# Patient Record
Sex: Female | Born: 1980
Health system: Southern US, Community
[De-identification: ages and names within clinical notes are randomized; demographics above are authoritative.]

## PROBLEM LIST (undated history)

## (undated) DIAGNOSIS — IMO0002 Reserved for concepts with insufficient information to code with codable children: Secondary | ICD-10-CM

## (undated) DIAGNOSIS — R87619 Unspecified abnormal cytological findings in specimens from cervix uteri: Secondary | ICD-10-CM

## (undated) HISTORY — PX: OTHER SURGICAL HISTORY: SHX169

---

## 2010-06-06 ENCOUNTER — Ambulatory Visit (HOSPITAL_COMMUNITY): Admission: RE | Admit: 2010-06-06 | Discharge: 2010-06-06 | Payer: Self-pay | Admitting: Physical Therapy

## 2010-12-24 ENCOUNTER — Encounter
Admission: RE | Admit: 2010-12-24 | Discharge: 2010-12-24 | Payer: Self-pay | Source: Home / Self Care | Attending: Family Medicine | Admitting: Family Medicine

## 2011-06-23 LAB — OB RESULTS CONSOLE ABO/RH: RH Type: POSITIVE

## 2011-06-23 LAB — OB RESULTS CONSOLE HEPATITIS B SURFACE ANTIGEN: Hepatitis B Surface Ag: NEGATIVE

## 2011-06-23 LAB — OB RESULTS CONSOLE HIV ANTIBODY (ROUTINE TESTING)
HIV: NONREACTIVE
HIV: NONREACTIVE

## 2011-06-23 LAB — OB RESULTS CONSOLE RPR
RPR: NONREACTIVE
RPR: NONREACTIVE

## 2011-06-23 LAB — OB RESULTS CONSOLE RUBELLA ANTIBODY, IGM: Rubella: NON-IMMUNE/NOT IMMUNE

## 2011-06-23 LAB — OB RESULTS CONSOLE ANTIBODY SCREEN: Antibody Screen: NEGATIVE

## 2011-09-28 LAB — OB RESULTS CONSOLE GC/CHLAMYDIA: Gonorrhea: NEGATIVE

## 2012-01-11 ENCOUNTER — Other Ambulatory Visit (HOSPITAL_COMMUNITY): Payer: Self-pay | Admitting: Radiology

## 2012-01-11 DIAGNOSIS — I493 Ventricular premature depolarization: Secondary | ICD-10-CM

## 2012-01-12 ENCOUNTER — Ambulatory Visit (HOSPITAL_COMMUNITY): Payer: 59 | Attending: Cardiovascular Disease | Admitting: Radiology

## 2012-01-12 DIAGNOSIS — I2510 Atherosclerotic heart disease of native coronary artery without angina pectoris: Secondary | ICD-10-CM | POA: Insufficient documentation

## 2012-01-12 DIAGNOSIS — I4949 Other premature depolarization: Secondary | ICD-10-CM | POA: Insufficient documentation

## 2012-01-12 DIAGNOSIS — I493 Ventricular premature depolarization: Secondary | ICD-10-CM

## 2012-01-13 ENCOUNTER — Encounter (HOSPITAL_COMMUNITY): Payer: Self-pay | Admitting: Family Medicine

## 2012-01-14 ENCOUNTER — Telehealth: Payer: Self-pay | Admitting: Cardiology

## 2012-01-14 NOTE — Telephone Encounter (Signed)
Pt aware of results 

## 2012-01-14 NOTE — Telephone Encounter (Signed)
New problem:  Echo test result.

## 2012-01-14 NOTE — Telephone Encounter (Signed)
Dr Christell Constant ordered testing.  Will forward to him to review with pt.

## 2012-01-20 ENCOUNTER — Institutional Professional Consult (permissible substitution): Payer: Self-pay | Admitting: Cardiology

## 2012-03-18 LAB — OB RESULTS CONSOLE GBS: GBS: NEGATIVE

## 2012-04-16 ENCOUNTER — Inpatient Hospital Stay (HOSPITAL_COMMUNITY)
Admission: AD | Admit: 2012-04-16 | Discharge: 2012-04-18 | DRG: 775 | Disposition: A | Discharge status: Home / Self Care | Payer: 59 | Source: Ambulatory Visit | Attending: Obstetrics and Gynecology | Admitting: Obstetrics and Gynecology

## 2012-04-16 ENCOUNTER — Encounter (HOSPITAL_COMMUNITY): Payer: Self-pay | Admitting: *Deleted

## 2012-04-16 HISTORY — DX: Reserved for concepts with insufficient information to code with codable children: IMO0002

## 2012-04-16 HISTORY — DX: Unspecified abnormal cytological findings in specimens from cervix uteri: R87.619

## 2012-04-16 LAB — CBC
MCH: 28.1 pg (ref 26.0–34.0)
MCHC: 32.7 g/dL (ref 30.0–36.0)
RDW: 14.2 % (ref 11.5–15.5)

## 2012-04-16 MED ORDER — ONDANSETRON HCL 4 MG/2ML IJ SOLN: 4.0000 mg | Freq: Four times a day (QID) | INTRAMUSCULAR | Status: DC | PRN

## 2012-04-16 MED ORDER — LIDOCAINE HCL (PF) 1 % IJ SOLN
30.0000 mL | INTRAMUSCULAR | Status: DC | PRN
  Administered 2012-04-16: 30 mL via SUBCUTANEOUS
  Filled 2012-04-16: qty 30

## 2012-04-16 MED ORDER — OXYCODONE-ACETAMINOPHEN 5-325 MG PO TABS: 1.0000 | ORAL_TABLET | ORAL | Status: DC | PRN

## 2012-04-16 MED ORDER — FLEET ENEMA 7-19 GM/118ML RE ENEM: 1.0000 | ENEMA | RECTAL | Status: DC | PRN

## 2012-04-16 MED ORDER — ACETAMINOPHEN 325 MG PO TABS: 650.0000 mg | ORAL_TABLET | ORAL | Status: DC | PRN

## 2012-04-16 MED ORDER — CITRIC ACID-SODIUM CITRATE 334-500 MG/5ML PO SOLN: 30.0000 mL | ORAL | Status: DC | PRN

## 2012-04-16 MED ORDER — LACTATED RINGERS IV SOLN
INTRAVENOUS | Status: DC
  Administered 2012-04-16: 21:00:00 via INTRAVENOUS

## 2012-04-16 MED ORDER — LACTATED RINGERS IV SOLN: 500.0000 mL | INTRAVENOUS | Status: DC | PRN

## 2012-04-16 MED ORDER — OXYTOCIN 20 UNITS IN LACTATED RINGERS INFUSION - SIMPLE
125.0000 mL/h | Freq: Once | INTRAVENOUS | Status: AC
  Administered 2012-04-16: 125 mL/h via INTRAVENOUS

## 2012-04-16 MED ORDER — OXYTOCIN BOLUS FROM INFUSION
500.0000 mL | Freq: Once | INTRAVENOUS | Status: DC
  Filled 2012-04-16: qty 1000
  Filled 2012-04-16: qty 500

## 2012-04-16 MED ORDER — IBUPROFEN 600 MG PO TABS
600.0000 mg | ORAL_TABLET | Freq: Four times a day (QID) | ORAL | Status: DC | PRN
  Administered 2012-04-16: 600 mg via ORAL
  Filled 2012-04-16: qty 1

## 2012-04-16 MED ORDER — BUTORPHANOL TARTRATE 2 MG/ML IJ SOLN: 1.0000 mg | INTRAMUSCULAR | Status: DC | PRN

## 2012-04-16 NOTE — Progress Notes (Signed)
Delivery Note Rapid second stage Terminal bradycardia into 50-70s vtx at LOA, +3 VE D/W pt/husband to shorten second stage in view of bradycardia Straight cath Kiwi applied, 1 uc, gentle 2 finger traction to VE, no pop offs VMI Apgars 8/9, weight/art pH pending Nuchal cord x 1 reduced at delivery Placenta 3 vessels, intact EBL 350cc Cx/vagina without laceration Rt labia minora with transection/bleeding is repaired Superficial ML lac not bleeding , not repaired Patient and infant stable in LDR

## 2012-04-16 NOTE — MAU Note (Signed)
Pt states, " I've been contracting since 1120, and they have been every 3-4 mins apart for an hour."

## 2012-04-16 NOTE — H&P (Signed)
Morgan Mayo is a 31 y.o. female presenting for UCs. SROM in LDR, clear. Maternal Medical History:  Reason for admission: Reason for admission: contractions.  Contractions: Onset was 3-5 hours ago.    Fetal activity: Perceived fetal activity is normal.      OB History    Grav Para Term Preterm Abortions TAB SAB Ect Mult Living   1              Past Medical History  Diagnosis Date  . Abnormal Pap smear    Past Surgical History  Procedure Date  . Belly button    Family History: family history is not on file. Social History:  reports that she has never smoked. She has never used smokeless tobacco. She reports that she does not drink alcohol or use illicit drugs.  Review of Systems  Eyes: Negative for blurred vision.  Gastrointestinal: Negative for abdominal pain.  Neurological: Negative for headaches.    Dilation: Lip/rim Effacement (%): 100 Station: +2 Exam by:: Dr. Henderson Cloud Blood pressure 112/71, pulse 94, temperature 98.1 F (36.7 C), temperature source Oral, resp. rate 20, height 5\' 6"  (1.676 m), weight 88.905 kg (196 lb). Maternal Exam:  Uterine Assessment: Contraction strength is firm.  Contraction frequency is regular.   Abdomen: Patient reports no abdominal tenderness. Fetal presentation: vertex     Physical Exam  Cardiovascular: Normal rate and regular rhythm.   Respiratory: Effort normal and breath sounds normal.  Neurological: She has normal reflexes.    Prenatal labs: ABO, Rh: B/Positive/-- (06/26 0000) Antibody: Negative (06/26 0000) Rubella: Nonimmune (06/26 0000) RPR: Nonreactive (09/25 0000)  HBsAg: Negative (06/26 0000)  HIV: Non-reactive (09/25 0000)  GBS: Negative (03/22 0000)   Assessment/Plan: 31 yo G1P0 at term in active labor. Admit, anticipate vaginal delivery   Lindell Renfrew II,Caileigh Canche E 04/16/2012, 11:27 PM

## 2012-04-17 ENCOUNTER — Encounter (HOSPITAL_COMMUNITY): Payer: Self-pay | Admitting: *Deleted

## 2012-04-17 LAB — CBC
MCH: 27.9 pg (ref 26.0–34.0)
MCHC: 32.3 g/dL (ref 30.0–36.0)
MCV: 86.2 fL (ref 78.0–100.0)
Platelets: 213 10*3/uL (ref 150–400)
RBC: 3.77 MIL/uL — ABNORMAL LOW (ref 3.87–5.11)

## 2012-04-17 LAB — RPR: RPR Ser Ql: NONREACTIVE

## 2012-04-17 MED ORDER — SIMETHICONE 80 MG PO CHEW: 80.0000 mg | CHEWABLE_TABLET | ORAL | Status: DC | PRN

## 2012-04-17 MED ORDER — BENZOCAINE-MENTHOL 20-0.5 % EX AERO
1.0000 "application " | INHALATION_SPRAY | CUTANEOUS | Status: DC | PRN
  Administered 2012-04-17: 1 via TOPICAL

## 2012-04-17 MED ORDER — ONDANSETRON HCL 4 MG PO TABS: 4.0000 mg | ORAL_TABLET | ORAL | Status: DC | PRN

## 2012-04-17 MED ORDER — TETANUS-DIPHTH-ACELL PERTUSSIS 5-2.5-18.5 LF-MCG/0.5 IM SUSP: 0.5000 mL | Freq: Once | INTRAMUSCULAR | Status: DC

## 2012-04-17 MED ORDER — BISACODYL 10 MG RE SUPP: 10.0000 mg | Freq: Every day | RECTAL | Status: DC | PRN

## 2012-04-17 MED ORDER — WITCH HAZEL-GLYCERIN EX PADS: 1.0000 "application " | MEDICATED_PAD | CUTANEOUS | Status: DC | PRN

## 2012-04-17 MED ORDER — DIPHENHYDRAMINE HCL 25 MG PO CAPS: 25.0000 mg | ORAL_CAPSULE | Freq: Four times a day (QID) | ORAL | Status: DC | PRN

## 2012-04-17 MED ORDER — IBUPROFEN 600 MG PO TABS
600.0000 mg | ORAL_TABLET | Freq: Four times a day (QID) | ORAL | Status: DC
  Administered 2012-04-17 – 2012-04-18 (×5): 600 mg via ORAL
  Filled 2012-04-17 (×5): qty 1

## 2012-04-17 MED ORDER — DIBUCAINE 1 % RE OINT: 1.0000 "application " | TOPICAL_OINTMENT | RECTAL | Status: DC | PRN

## 2012-04-17 MED ORDER — ONDANSETRON HCL 4 MG/2ML IJ SOLN: 4.0000 mg | INTRAMUSCULAR | Status: DC | PRN

## 2012-04-17 MED ORDER — FLEET ENEMA 7-19 GM/118ML RE ENEM: 1.0000 | ENEMA | Freq: Every day | RECTAL | Status: DC | PRN

## 2012-04-17 MED ORDER — SENNOSIDES-DOCUSATE SODIUM 8.6-50 MG PO TABS
2.0000 | ORAL_TABLET | Freq: Every day | ORAL | Status: DC
  Administered 2012-04-17: 2 via ORAL

## 2012-04-17 MED ORDER — LANOLIN HYDROUS EX OINT: TOPICAL_OINTMENT | CUTANEOUS | Status: DC | PRN

## 2012-04-17 MED ORDER — ZOLPIDEM TARTRATE 5 MG PO TABS: 5.0000 mg | ORAL_TABLET | Freq: Every evening | ORAL | Status: DC | PRN

## 2012-04-17 MED ORDER — OXYCODONE-ACETAMINOPHEN 5-325 MG PO TABS: 1.0000 | ORAL_TABLET | ORAL | Status: DC | PRN

## 2012-04-17 MED ORDER — PRENATAL MULTIVITAMIN CH
1.0000 | ORAL_TABLET | Freq: Every day | ORAL | Status: DC
  Administered 2012-04-17 – 2012-04-18 (×2): 1 via ORAL
  Filled 2012-04-17 (×2): qty 1

## 2012-04-17 MED ORDER — BENZOCAINE-MENTHOL 20-0.5 % EX AERO
INHALATION_SPRAY | CUTANEOUS | Status: AC
  Administered 2012-04-17: 1 via TOPICAL
  Filled 2012-04-17: qty 56

## 2012-04-17 NOTE — Progress Notes (Signed)
Transferred to 115 via wheelchair, infant and husband with pt

## 2012-04-17 NOTE — Progress Notes (Signed)
PPD #1/2 No c/o, decreasing lochia, voiding/ambulating  Blood pressure 118/80, pulse 98, temperature 98.2 F (36.8 C), temperature source Oral, resp. rate 18, height 5\' 6"  (1.676 m), weight 88.905 kg (196 lb), SpO2 95.00%, unknown if currently breastfeeding.  FFNT  Results for orders placed during the hospital encounter of 04/16/12 (from the past 24 hour(s))  CBC     Status: Abnormal   Collection Time   04/16/12  9:30 PM      Component Value Range   WBC 15.6 (*) 4.0 - 10.5 (K/uL)   RBC 4.49  3.87 - 5.11 (MIL/uL)   Hemoglobin 12.6  12.0 - 15.0 (g/dL)   HCT 16.1  09.6 - 04.5 (%)   MCV 85.7  78.0 - 100.0 (fL)   MCH 28.1  26.0 - 34.0 (pg)   MCHC 32.7  30.0 - 36.0 (g/dL)   RDW 40.9  81.1 - 91.4 (%)   Platelets 231  150 - 400 (K/uL)  CBC     Status: Abnormal   Collection Time   04/17/12  5:00 AM      Component Value Range   WBC 20.3 (*) 4.0 - 10.5 (K/uL)   RBC 3.77 (*) 3.87 - 5.11 (MIL/uL)   Hemoglobin 10.5 (*) 12.0 - 15.0 (g/dL)   HCT 78.2 (*) 95.6 - 46.0 (%)   MCV 86.2  78.0 - 100.0 (fL)   MCH 27.9  26.0 - 34.0 (pg)   MCHC 32.3  30.0 - 36.0 (g/dL)   RDW 21.3  08.6 - 57.8 (%)   Platelets 213  150 - 400 (K/uL)   A: Satisfactory  P: per orders      Prob D/C tomorrow

## 2012-04-18 NOTE — Discharge Summary (Signed)
Obstetric Discharge Summary Reason for Admission: onset of labor Prenatal Procedures: ultrasound Intrapartum Procedures: vacuum Postpartum Procedures: none Complications-Operative and Postpartum: labial laceration Hemoglobin  Date Value Range Status  04/17/2012 10.5* 12.0-15.0 (g/dL) Final     HCT  Date Value Range Status  04/17/2012 32.5* 36.0-46.0 (%) Final    Physical Exam:  General: alert and cooperative Lochia: appropriate Uterine Fundus: firm Incision: n/a DVT Evaluation: No evidence of DVT seen on physical exam.  Discharge Diagnoses: Term Pregnancy-delivered  Discharge Information: Date: 04/18/2012 Activity: pelvic rest Diet: routine Medications: PNV and Ibuprofen Condition: stable Instructions: refer to practice specific booklet Discharge to: home Follow-up Information    Schedule an appointment as soon as possible for a visit in 6 weeks to follow up.         Newborn Data: Live born female  Birth Weight: 6 lb 3.7 oz (2825 g) APGAR: 8, 9  Home with mother.  Morgan Mayo 04/18/2012, 8:47 AM

## 2012-04-19 ENCOUNTER — Inpatient Hospital Stay (HOSPITAL_COMMUNITY): Payer: 59

## 2013-04-18 ENCOUNTER — Other Ambulatory Visit: Payer: Self-pay | Admitting: Nurse Practitioner

## 2013-04-18 MED ORDER — CEPHALEXIN 500 MG PO CAPS: 500.0000 mg | ORAL_CAPSULE | Freq: Four times a day (QID) | ORAL | Status: DC

## 2013-08-09 ENCOUNTER — Ambulatory Visit (INDEPENDENT_AMBULATORY_CARE_PROVIDER_SITE_OTHER): Payer: 59

## 2013-08-09 ENCOUNTER — Other Ambulatory Visit: Payer: Self-pay | Admitting: Family Medicine

## 2013-08-09 DIAGNOSIS — M542 Cervicalgia: Secondary | ICD-10-CM

## 2013-11-01 ENCOUNTER — Ambulatory Visit (INDEPENDENT_AMBULATORY_CARE_PROVIDER_SITE_OTHER): Payer: 59 | Admitting: General Practice

## 2013-11-01 ENCOUNTER — Encounter: Payer: Self-pay | Admitting: General Practice

## 2013-11-01 VITALS — BP 122/78 | HR 92 | Temp 98.5°F | Ht 66.5 in | Wt 170.5 lb

## 2013-11-01 DIAGNOSIS — Z0000 Encounter for general adult medical examination without abnormal findings: Secondary | ICD-10-CM

## 2013-11-01 NOTE — Patient Instructions (Signed)
Exercise to Stay Healthy Exercise helps you become and stay healthy. EXERCISE IDEAS AND TIPS Choose exercises that:  You enjoy.  Fit into your day. You do not need to exercise really hard to be healthy. You can do exercises at a slow or medium level and stay healthy. You can:  Stretch before and after working out.  Try yoga, Pilates, or tai chi.  Lift weights.  Walk fast, swim, jog, run, climb stairs, bicycle, dance, or rollerskate.  Take aerobic classes. Exercises that burn about 150 calories:  Running 1  miles in 15 minutes.  Playing volleyball for 45 to 60 minutes.  Washing and waxing a car for 45 to 60 minutes.  Playing touch football for 45 minutes.  Walking 1  miles in 35 minutes.  Pushing a stroller 1  miles in 30 minutes.  Playing basketball for 30 minutes.  Raking leaves for 30 minutes.  Bicycling 5 miles in 30 minutes.  Walking 2 miles in 30 minutes.  Dancing for 30 minutes.  Shoveling snow for 15 minutes.  Swimming laps for 20 minutes.  Walking up stairs for 15 minutes.  Bicycling 4 miles in 15 minutes.  Gardening for 30 to 45 minutes.  Jumping rope for 15 minutes.  Washing windows or floors for 45 to 60 minutes. Document Released: 01/16/2011 Document Revised: 03/07/2012 Document Reviewed: 01/16/2011 [employer] Patient Information 2014 [employer].  

## 2013-11-01 NOTE — Progress Notes (Signed)
  Subjective:    Patient ID: Morgan Mayo, female    DOB: [birth year], 32 y.o.   MRN: [id number]  HPI Patient presents today for annual physical exam excluding pap. Reports working on eating a healthy diet and incorporating regular exercise. She denies any problems or concerns at this time.     Review of Systems  Constitutional: Negative for fever and chills.  Respiratory: Negative for chest tightness and shortness of breath.   Cardiovascular: Negative for chest pain and palpitations.  Gastrointestinal: Negative for nausea, vomiting, abdominal pain, diarrhea, constipation and blood in stool.  Genitourinary: Negative for dysuria, hematuria, flank pain and difficulty urinating.  Musculoskeletal: Negative for back pain and neck pain.  Neurological: Negative for dizziness, weakness and headaches.  Psychiatric/Behavioral: Negative for suicidal ideas.       Objective:   Physical Exam  Constitutional: She is oriented to person, place, and time. She appears well-developed and well-nourished.  HENT:  Head: Normocephalic and atraumatic.  Right Ear: External ear normal.  Left Ear: External ear normal.  Mouth/Throat: Oropharynx is clear and moist.  Eyes: Conjunctivae and EOM are normal. Pupils are equal, round, and reactive to light.  Neck: Normal range of motion. Neck supple. No thyromegaly present.  Cardiovascular: Normal rate, regular rhythm and normal heart sounds.   Pulmonary/Chest: Effort normal and breath sounds normal. No respiratory distress. She exhibits no tenderness.  Abdominal: Soft. Bowel sounds are normal. She exhibits no distension. There is no tenderness.  Lymphadenopathy:    She has no cervical adenopathy.  Neurological: She is alert and oriented to person, place, and time.  Skin: Skin is warm and dry.  Psychiatric: She has a normal mood and affect.          Assessment & Plan:  1. Annual physical exam - POCT CBC; Future - CMP14+EGFR; Future - NMR, lipoprofile;  Future -discussed regular exercise and healthy eating -RTO prn -Patient verbalized understanding -Coralie Keens, FNP-C

## 2013-11-02 ENCOUNTER — Other Ambulatory Visit (INDEPENDENT_AMBULATORY_CARE_PROVIDER_SITE_OTHER): Payer: 59

## 2013-11-02 DIAGNOSIS — Z0000 Encounter for general adult medical examination without abnormal findings: Secondary | ICD-10-CM

## 2013-11-02 LAB — POCT CBC
HCT, POC: 40.4 % (ref 37.7–47.9)
Lymph, poc: 1.5 (ref 0.6–3.4)
MCH, POC: 28 pg (ref 27–31.2)
MCHC: 33.5 g/dL (ref 31.8–35.4)
MCV: 83.6 fL (ref 80–97)
RDW, POC: 13.6 %
WBC: 5.1 10*3/uL (ref 4.6–10.2)

## 2013-11-02 NOTE — Progress Notes (Signed)
Pt came in for labs only 

## 2013-11-03 NOTE — Progress Notes (Signed)
Patient aware of results.

## 2013-11-04 LAB — CMP14+EGFR
ALT: 17 IU/L (ref 0–32)
AST: 17 IU/L (ref 0–40)
Albumin/Globulin Ratio: 1.5 (ref 1.1–2.5)
Alkaline Phosphatase: 77 IU/L (ref 39–117)
BUN/Creatinine Ratio: 17 (ref 8–20)
CO2: 24 mmol/L (ref 18–29)
Creatinine, Ser: 0.9 mg/dL (ref 0.57–1.00)
GFR calc non Af Amer: 86 mL/min/{1.73_m2} (ref >59)
Globulin, Total: 2.7 g/dL (ref 1.5–4.5)
Potassium: 4.3 mmol/L (ref 3.5–5.2)
Sodium: 140 mmol/L (ref 134–144)

## 2013-11-04 LAB — NMR, LIPOPROFILE
HDL Particle Number: 40.5 umol/L (ref >=30.5)
LP-IR Score: <25 (ref <=45)
Small LDL Particle Number: <90 nmol/L (ref <=527)

## 2013-11-07 ENCOUNTER — Other Ambulatory Visit: Payer: Self-pay | Admitting: Nurse Practitioner

## 2013-11-07 MED ORDER — CEPHALEXIN 500 MG PO CAPS: 500.0000 mg | ORAL_CAPSULE | Freq: Four times a day (QID) | ORAL | Status: DC

## 2013-11-10 ENCOUNTER — Encounter: Payer: Self-pay | Admitting: Physician Assistant

## 2013-11-10 ENCOUNTER — Ambulatory Visit (INDEPENDENT_AMBULATORY_CARE_PROVIDER_SITE_OTHER): Payer: 59 | Admitting: Physician Assistant

## 2013-11-10 VITALS — BP 119/75 | HR 95 | Temp 99.5°F | Ht 66.5 in | Wt 170.0 lb

## 2013-11-10 DIAGNOSIS — R21 Rash and other nonspecific skin eruption: Secondary | ICD-10-CM

## 2013-11-10 DIAGNOSIS — B354 Tinea corporis: Secondary | ICD-10-CM

## 2013-11-10 MED ORDER — TRIAMCINOLONE ACETONIDE 0.1 % EX OINT: 1.0000 "application " | TOPICAL_OINTMENT | Freq: Two times a day (BID) | CUTANEOUS | Status: DC

## 2013-11-10 MED ORDER — TERBINAFINE HCL 250 MG PO TABS: 250.0000 mg | ORAL_TABLET | Freq: Every day | ORAL | Status: DC

## 2013-11-12 NOTE — Progress Notes (Signed)
  Subjective:    Patient ID: Morgan Mayo, female    DOB: Jun 27, 1981, 32 y.o.   MRN: [id number]  HPI 32 y/o female presents with 2+ week hx of worsening, pruritic rash on neck. Has tried OTC Lamisil cream and Benadryl cream with no relief. No other past or current lesions present. Rash is more pruritic with heat. 30 month old son is currently being treated for Tinea Corporis with oral terbinifine due to resistance of treatment with topicals.     Review of Systems  Constitutional: Negative.   HENT: Negative.   Skin: Positive for color change and rash. Negative for wound.  Neurological: Negative.   Psychiatric/Behavioral: Negative.        Objective:   Physical Exam  Constitutional: She is oriented to person, place, and time. She appears well-developed and well-nourished. No distress.  Neck: Normal range of motion. Neck supple. No thyromegaly present.  Lymphadenopathy:    She has no cervical adenopathy.  Neurological: She is alert and oriented to person, place, and time. She has normal reflexes.  Skin: She is diaphoretic.  2 annular lesions with raised, erythematous borders and central clearing.           Assessment & Plan:  1. Tinea Corporis: Will tx w/ 250mg  Terbinifine PO q day x 14 days. Also prescribed TAC ointment for symptomatic relief of pruritis to use on AA for up to 10 days if needed. Skin scraping was performed for fungal culture, however will proceed to empirically treat while awaiting path results. Advised patient to rtc if rash did not clear with treatment plan.

## 2014-02-06 ENCOUNTER — Other Ambulatory Visit: Payer: Self-pay | Admitting: Nurse Practitioner

## 2014-02-06 MED ORDER — FLUCONAZOLE 150 MG PO TABS: ORAL_TABLET | ORAL | Status: DC

## 2014-08-01 IMAGING — CR DG CERVICAL SPINE COMPLETE 4+V
5 series · 5 of 5 positions shown · non-contrast
Comparison: None.

CLINICAL DATA: Neck pain

CERVICAL SPINE - COMPLETE 4+ VIEW

[view not recorded (1 of 5)]
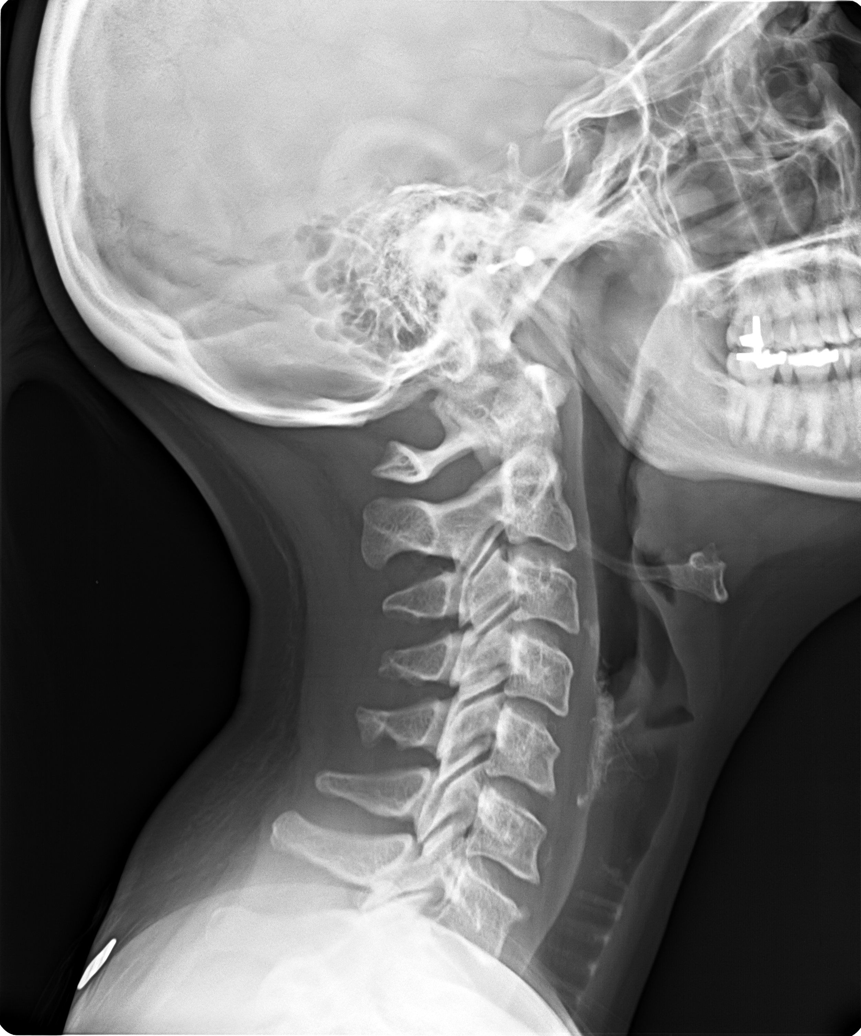

[view not recorded (2 of 5)]
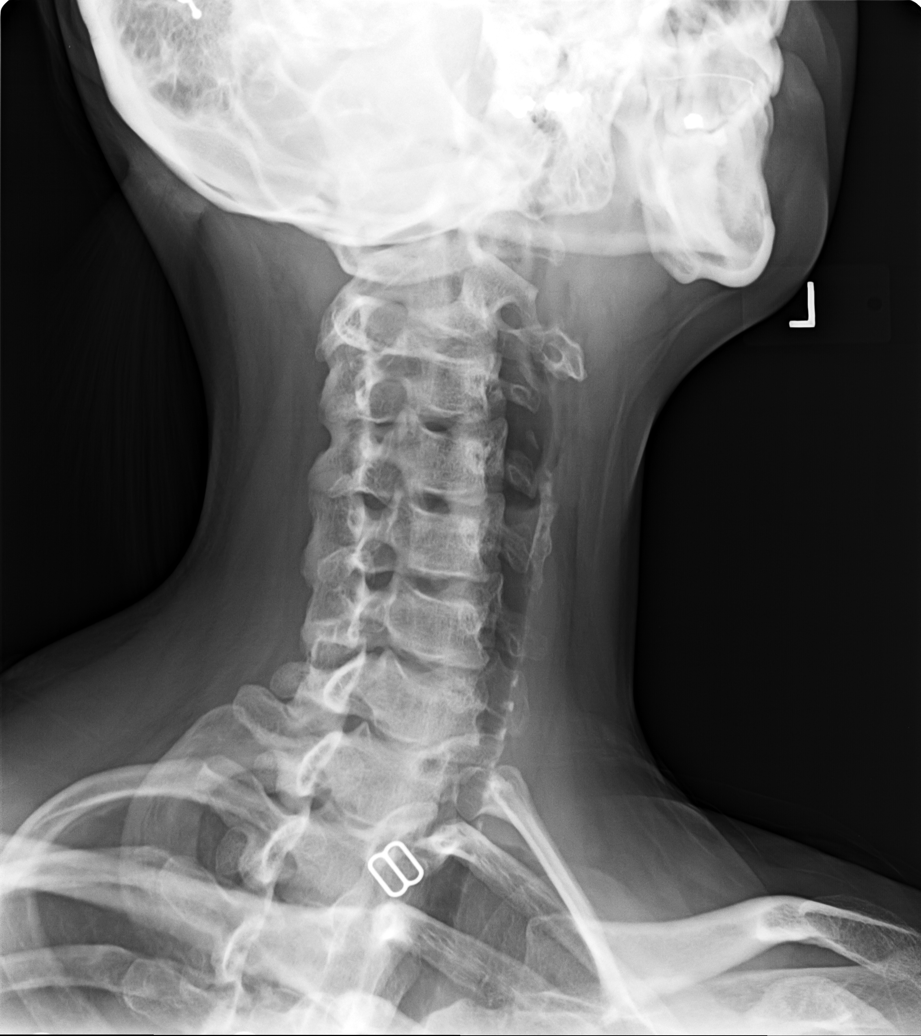

[view not recorded (3 of 5)]
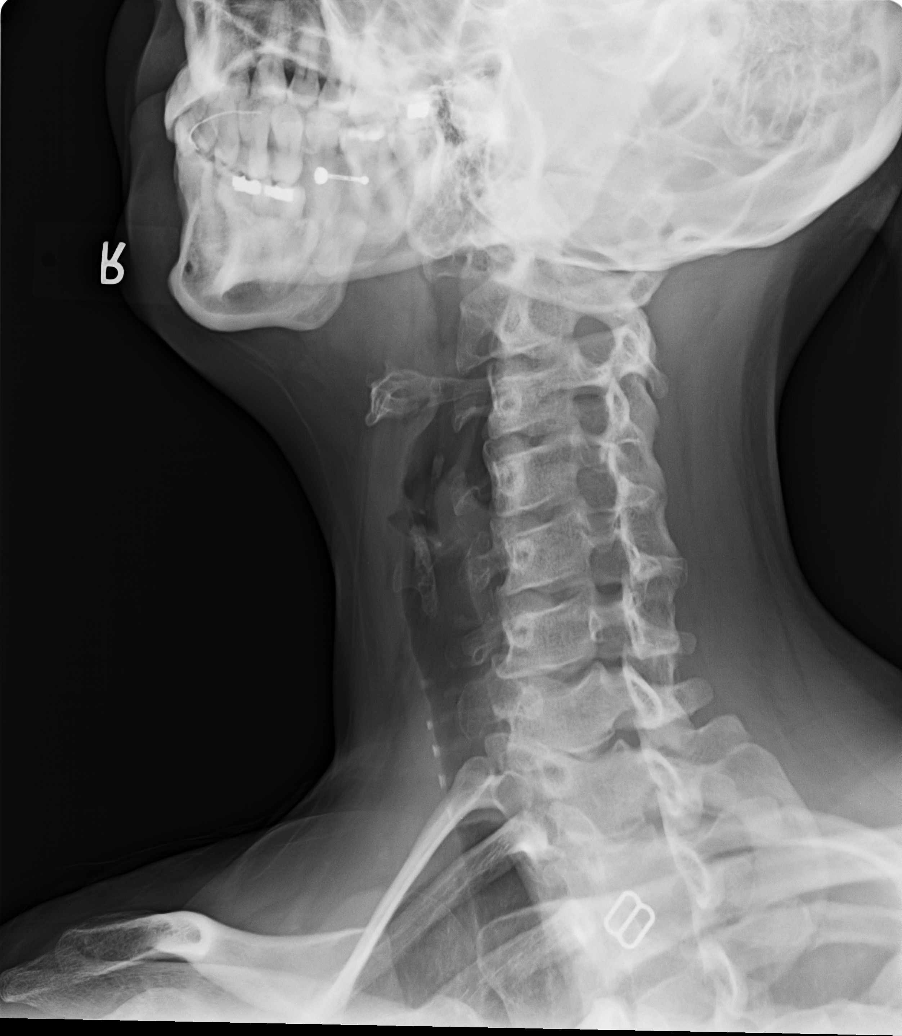

[view not recorded (4 of 5)]
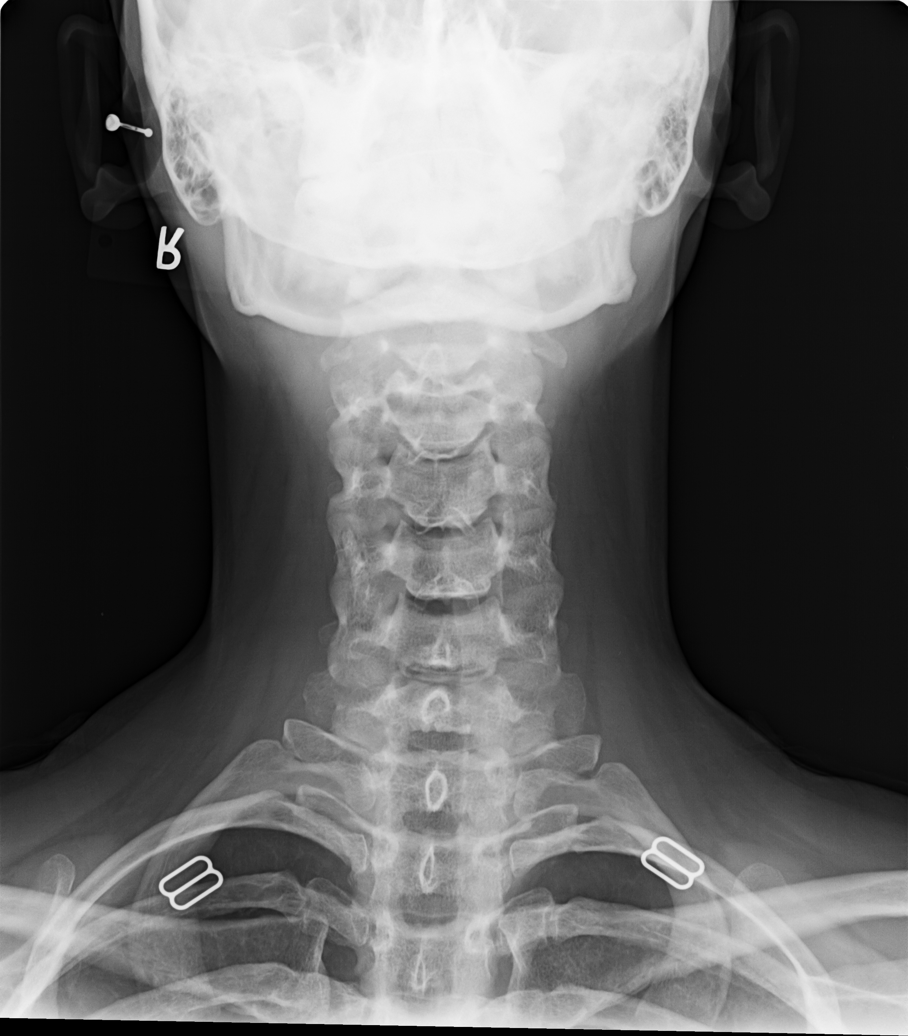

[view not recorded (5 of 5)]
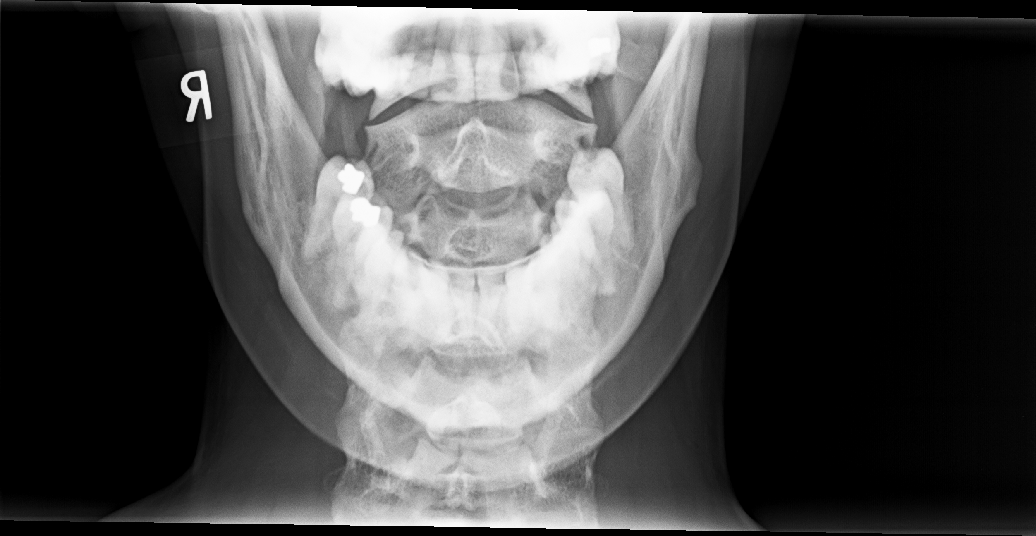

[5 of 5 positions shown; findings below may reference images not displayed]

FINDINGS: Frontal, lateral, open mouth odontoid, and bilateral
oblique views were obtained.  There is no fracture or
spondylolisthesis.  Prevertebral soft tissues and predental space
regions are normal.  Disc spaces appear intact.  There is no
appreciable exit foraminal narrowing on the oblique views.  No
erosive change.
IMPRESSION: No fracture or spondylolisthesis.  No appreciable arthropathy.

## 2014-10-29 ENCOUNTER — Encounter: Payer: Self-pay | Admitting: Physician Assistant

## 2015-01-29 ENCOUNTER — Other Ambulatory Visit: Payer: Self-pay | Admitting: *Deleted

## 2015-01-29 MED ORDER — NAPROXEN SODIUM 220 MG PO TABS: 220.0000 mg | ORAL_TABLET | Freq: Two times a day (BID) | ORAL | Status: DC

## 2015-01-29 MED ORDER — LORATADINE 5 MG/5ML PO SYRP: 10.0000 mg | ORAL_SOLUTION | Freq: Every day | ORAL | Status: DC

## 2015-01-29 MED ORDER — ASPIRIN EC 81 MG PO TBEC: 81.0000 mg | DELAYED_RELEASE_TABLET | Freq: Every day | ORAL | Status: DC

## 2015-03-18 ENCOUNTER — Other Ambulatory Visit: Payer: Self-pay | Admitting: *Deleted

## 2015-03-18 MED ORDER — DEXTROMETHORPHAN POLISTIREX 30 MG/5ML PO LQCR: 30.0000 mg | ORAL | Status: DC | PRN

## 2015-03-19 ENCOUNTER — Encounter: Payer: Self-pay | Admitting: Family Medicine

## 2015-03-19 ENCOUNTER — Ambulatory Visit (INDEPENDENT_AMBULATORY_CARE_PROVIDER_SITE_OTHER): Payer: 59 | Admitting: Family Medicine

## 2015-03-19 VITALS — BP 120/83 | HR 99 | Temp 98.3°F | Wt 178.0 lb

## 2015-03-19 DIAGNOSIS — J069 Acute upper respiratory infection, unspecified: Secondary | ICD-10-CM

## 2015-03-19 DIAGNOSIS — J40 Bronchitis, not specified as acute or chronic: Secondary | ICD-10-CM

## 2015-03-19 DIAGNOSIS — J209 Acute bronchitis, unspecified: Secondary | ICD-10-CM

## 2015-03-19 MED ORDER — MUCINEX DM MAXIMUM STRENGTH 60-1200 MG PO TB12: 2.0000 | ORAL_TABLET | Freq: Two times a day (BID) | ORAL | Status: DC

## 2015-03-19 MED ORDER — AZITHROMYCIN 250 MG PO TABS: ORAL_TABLET | ORAL | Status: DC

## 2015-03-19 NOTE — Progress Notes (Signed)
   Subjective:    Patient ID: Morgan Mayo, female    DOB: 1981-11-10, 34 y.o.   MRN: [id number]  HPI The patient has had cough nasal congestion and chest pain for the past 5 days.   Review of Systems  Constitutional: Negative for fever, chills and fatigue.  HENT: Positive for congestion and sneezing. Negative for ear pain, postnasal drip, rhinorrhea, sinus pressure, trouble swallowing and voice change.   Eyes: Negative.   Respiratory: Positive for cough. Negative for chest tightness and wheezing.   Cardiovascular: Negative.   Gastrointestinal: Negative.   Endocrine: Negative.   Genitourinary: Negative.   Musculoskeletal: Negative.   Allergic/Immunologic: Negative.   Neurological: Negative.   Hematological: Negative.   Psychiatric/Behavioral: Negative.   All other systems reviewed and are negative.      Objective:   Physical Exam  Constitutional: She is oriented to person, place, and time. She appears well-developed and well-nourished. No distress.  HENT:  Head: Normocephalic and atraumatic.  Right Ear: External ear normal.  Left Ear: External ear normal.  Mouth/Throat: Oropharynx is clear and moist. No oropharyngeal exudate.  Nasal congestion  Eyes: Conjunctivae and EOM are normal. Pupils are equal, round, and reactive to light. Right eye exhibits no discharge. Left eye exhibits no discharge. No scleral icterus.  Neck: Normal range of motion. Neck supple. No thyromegaly present.  Cardiovascular: Normal rate, regular rhythm and normal heart sounds.   No murmur heard. Pulmonary/Chest: Effort normal and breath sounds normal. She has no wheezes. She has no rales.  Patient has a congested cough.  Musculoskeletal: Normal range of motion.  Neurological: She is alert and oriented to person, place, and time.  Skin: No rash noted.  Psychiatric: She has a normal mood and affect. Her behavior is normal. Judgment and thought content normal.  Vitals reviewed.           Assessment & Plan:  1. URI (upper respiratory infection) -Drink plenty of fluids and take Mucinex  2. Bronchitis with bronchospasm -Take Mucinex and antibiotic as directed  Meds ordered this encounter  Medications  . azithromycin (ZITHROMAX) 250 MG tablet    Sig: Take 2 tablets today and then 1 tablet day 2-5    Dispense:  6 tablet    Refill:  0  . Dextromethorphan-Guaifenesin (MUCINEX DM MAXIMUM STRENGTH) 60-1200 MG TB12    Sig: Take 2 tablets by mouth 2 (two) times daily.    Dispense:  28 each    Refill:  1    Patient is paying with [employer] spending account and needs prescription   Patient Instructions  Take Mucinex maximum strength, blue and white in color, 1 twice daily with a large glass of water Take antibiotic as directed Use nasal saline as directed   Arrie Senate MD

## 2015-03-19 NOTE — Patient Instructions (Signed)
Take Mucinex maximum strength, blue and white in color, 1 twice daily with a large glass of water Take antibiotic as directed Use nasal saline as directed

## 2015-03-21 ENCOUNTER — Ambulatory Visit: Payer: 59 | Admitting: Family Medicine

## 2015-04-04 ENCOUNTER — Ambulatory Visit (INDEPENDENT_AMBULATORY_CARE_PROVIDER_SITE_OTHER): Payer: 59 | Admitting: Family Medicine

## 2015-04-04 ENCOUNTER — Encounter: Payer: Self-pay | Admitting: Family Medicine

## 2015-04-04 VITALS — BP 116/83 | HR 96 | Temp 99.0°F | Ht 66.5 in | Wt 178.0 lb

## 2015-04-04 DIAGNOSIS — R059 Cough, unspecified: Secondary | ICD-10-CM

## 2015-04-04 DIAGNOSIS — R05 Cough: Secondary | ICD-10-CM

## 2015-04-04 DIAGNOSIS — R509 Fever, unspecified: Secondary | ICD-10-CM | POA: Diagnosis not present

## 2015-04-04 DIAGNOSIS — J322 Chronic ethmoidal sinusitis: Secondary | ICD-10-CM | POA: Diagnosis not present

## 2015-04-04 DIAGNOSIS — J069 Acute upper respiratory infection, unspecified: Secondary | ICD-10-CM

## 2015-04-04 LAB — POCT CBC
Granulocyte percent: 58.9 %G (ref 37–80)
HCT, POC: 44.1 % (ref 37.7–47.9)
Hemoglobin: 13.4 g/dL (ref 12.2–16.2)
LYMPH, POC: 1.9 (ref 0.6–3.4)
MCH, POC: 26.2 pg — AB (ref 27–31.2)
MCHC: 30.5 g/dL — AB (ref 31.8–35.4)
MCV: 85.9 fL (ref 80–97)
MPV: 9 fL (ref 0–99.8)
PLATELET COUNT, POC: 235 10*3/uL (ref 142–424)
POC Granulocyte: 3.3 (ref 2–6.9)
POC LYMPH PERCENT: 33.1 %L (ref 10–50)
RBC: 5.13 M/uL (ref 4.04–5.48)
RDW, POC: 13.7 %
WBC: 5.6 10*3/uL (ref 4.6–10.2)

## 2015-04-04 LAB — POCT INFLUENZA A/B
Influenza A, POC: NEGATIVE
Influenza B, POC: NEGATIVE

## 2015-04-04 LAB — POCT RAPID STREP A (OFFICE): RAPID STREP A SCREEN: NEGATIVE

## 2015-04-04 MED ORDER — AMOXICILLIN 500 MG PO CAPS: 500.0000 mg | ORAL_CAPSULE | Freq: Three times a day (TID) | ORAL | Status: DC

## 2015-04-04 NOTE — Progress Notes (Signed)
Subjective:    Patient ID: Morgan Mayo, female    DOB: [birth year], 34 y.o.   MRN: [id number]  Fever  Associated symptoms include congestion, coughing, diarrhea, ear pain, headaches and nausea.  Cough Associated symptoms include ear pain, a fever and headaches.    Patient is here today complaining with a low grade fever, right side ear pain, sinus pressure, HA, cough, congestion, nausea and diarrhea.    Review of Systems  Constitutional: Positive for fever.  HENT: Positive for congestion, ear pain and sinus pressure.   Eyes: Negative.   Respiratory: Positive for cough.   Cardiovascular: Negative.   Gastrointestinal: Positive for nausea and diarrhea.  Endocrine: Negative.   Genitourinary: Negative.   Musculoskeletal: Negative.   Skin: Negative.   Allergic/Immunologic: Negative.   Neurological: Positive for headaches.  Hematological: Negative.   Psychiatric/Behavioral: Negative.      There are no active problems to display for this patient.  Outpatient Encounter Prescriptions as of 04/04/2015  Medication Sig  . dextromethorphan (DELSYM) 30 MG/5ML liquid Take 5 mLs (30 mg total) by mouth as needed for cough.  . etonogestrel-ethinyl estradiol (NUVARING) 0.12-0.015 MG/24HR vaginal ring Place 1 each vaginally every 28 (twenty-eight) days. Insert vaginally and leave in place for 3 consecutive weeks, then remove for 1 week.  . loratadine (CLARITIN) 10 MG tablet Take 10 mg by mouth daily as needed.  . [DISCONTINUED] acetaminophen (TYLENOL) 500 MG tablet Take 500 mg by mouth every 6 (six) hours as needed. Head ache  . [DISCONTINUED] aspirin EC 81 MG tablet Take 1 tablet (81 mg total) by mouth daily.  . [DISCONTINUED] azithromycin (ZITHROMAX) 250 MG tablet Take 2 tablets today and then 1 tablet day 2-5  . [DISCONTINUED] Dextromethorphan-Guaifenesin (MUCINEX DM MAXIMUM STRENGTH) 60-1200 MG TB12 Take 2 tablets by mouth 2 (two) times daily.  . [DISCONTINUED] fluconazole (DIFLUCAN) 150  MG tablet 1 po now and repeat in 1 week  . [DISCONTINUED] loratadine (CLARITIN) 5 MG/5ML syrup Take 10 mLs (10 mg total) by mouth daily.  . [DISCONTINUED] naproxen sodium (ANAPROX) 220 MG tablet Take 1 tablet (220 mg total) by mouth 2 (two) times daily with a meal.  . [DISCONTINUED] terbinafine (LAMISIL) 250 MG tablet Take 1 tablet (250 mg total) by mouth daily.  . [DISCONTINUED] triamcinolone ointment (KENALOG) 0.1 % Apply 1 application topically 2 (two) times daily.       Objective:   Physical Exam  Constitutional: She is oriented to person, place, and time. She appears well-developed and well-nourished. No distress.  HENT:  Head: Normocephalic and atraumatic.  Right Ear: External ear normal.  Left Ear: External ear normal.  Nose: Nose normal.  The throat is slightly red posteriorly and bilaterally.  Eyes: Conjunctivae and EOM are normal. Pupils are equal, round, and reactive to light. Right eye exhibits no discharge. Left eye exhibits no discharge. No scleral icterus.  Neck: Normal range of motion. Neck supple. No JVD present. No thyromegaly present.  There is some anterior cervical adenopathy on the right.  Cardiovascular: Normal rate and regular rhythm.  Exam reveals friction rub.   No murmur heard. Pulmonary/Chest: Effort normal and breath sounds normal. No respiratory distress. She has no wheezes. She has no rales. She exhibits no tenderness.  Minimal chest congestion with coughing.  Musculoskeletal: Normal range of motion. She exhibits no edema or tenderness.  Lymphadenopathy:    She has cervical adenopathy.  Neurological: She is alert and oriented to person, place, and time.  Skin: Skin is warm  and dry. No rash noted.  Psychiatric: She has a normal mood and affect. Her behavior is normal. Judgment and thought content normal.  Nursing note and vitals reviewed.  BP 116/83 mmHg  Pulse 96  Temp(Src) 99 F (37.2 C) (Oral)  Ht 5' 6.5" (1.689 m)  Wt 178 lb (80.74 kg)  BMI  28.30 kg/m2  Results for orders placed or performed in visit on 04/04/15  POCT CBC  Result Value Ref Range   WBC 5.6 4.6 - 10.2 K/uL   Lymph, poc 1.9 0.6 - 3.4   POC LYMPH PERCENT 33.1 10 - 50 %L   POC Granulocyte 3.3 2 - 6.9   Granulocyte percent 58.9 37 - 80 %G   RBC 5.13 4.04 - 5.48 M/uL   Hemoglobin 13.4 12.2 - 16.2 g/dL   HCT, POC 44.1 37.7 - 47.9 %   MCV 85.9 80 - 97 fL   MCH, POC 26.2 (A) 27 - 31.2 pg   MCHC 30.5 (A) 31.8 - 35.4 g/dL   RDW, POC 13.7 %   Platelet Count, POC 235 142 - 424 K/uL   MPV 9.0 0 - 99.8 fL  POCT Influenza A/B  Result Value Ref Range   Influenza A, POC Negative    Influenza B, POC Negative          Assessment & Plan:  1. Fever, unspecified fever cause -Take Tylenol for aches pains and fever - POCT CBC - POCT rapid strep A - POCT Influenza A/B  2. Cough -Use Mucinex and drink plenty of fluids - POCT CBC - POCT rapid strep A - POCT Influenza A/B  3. Ethmoid sinusitis, unspecified chronicity -Take antibiotic and use nasal saline frequently through the day - amoxicillin (AMOXIL) 500 MG capsule; Take 1 capsule (500 mg total) by mouth 3 (three) times daily.  Dispense: 30 capsule; Refill: 0  4. Acute URI -Get plenty of fluids, take Tylenol for aches pains and fever, use cough medicine as directed and take antibiotic  Patient Instructions  Take antibiotic as directed Take Mucinex maximum strength, blue and white in color, 1 twice daily with a large glass of water Continue to take Claritin for antihistamine as needed for head congestion Drink more fluids, avoid caffeine and eating a more bland diet for the next few days   Arrie Senate MD

## 2015-04-04 NOTE — Patient Instructions (Signed)
Take antibiotic as directed Take Mucinex maximum strength, blue and white in color, 1 twice daily with a large glass of water Continue to take Claritin for antihistamine as needed for head congestion Drink more fluids, avoid caffeine and eating a more bland diet for the next few days

## 2015-05-24 ENCOUNTER — Other Ambulatory Visit: Payer: Self-pay | Admitting: *Deleted

## 2015-05-24 MED ORDER — FLUCONAZOLE 150 MG PO TABS: 150.0000 mg | ORAL_TABLET | Freq: Once | ORAL | Status: DC

## 2015-07-15 ENCOUNTER — Other Ambulatory Visit (INDEPENDENT_AMBULATORY_CARE_PROVIDER_SITE_OTHER): Payer: 59

## 2015-07-15 DIAGNOSIS — R51 Headache: Secondary | ICD-10-CM | POA: Diagnosis not present

## 2015-07-15 LAB — POCT CBC
GRANULOCYTE PERCENT: 71.3 % (ref 37–80)
HCT, POC: 42.3 % (ref 37.7–47.9)
Hemoglobin: 13.3 g/dL (ref 12.2–16.2)
Lymph, poc: 1.7 (ref 0.6–3.4)
MCH, POC: 26.4 pg — AB (ref 27–31.2)
MCHC: 31.4 g/dL — AB (ref 31.8–35.4)
MCV: 83.9 fL (ref 80–97)
MPV: 9 fL (ref 0–99.8)
PLATELET COUNT, POC: 271 10*3/uL (ref 142–424)
POC GRANULOCYTE: 4.9 (ref 2–6.9)
POC LYMPH PERCENT: 25.1 %L (ref 10–50)
RBC: 5.04 M/uL (ref 4.04–5.48)
RDW, POC: 13.9 %
WBC: 6.9 10*3/uL (ref 4.6–10.2)

## 2015-07-15 NOTE — Progress Notes (Signed)
Lab only 

## 2015-07-16 ENCOUNTER — Ambulatory Visit (INDEPENDENT_AMBULATORY_CARE_PROVIDER_SITE_OTHER): Payer: 59

## 2015-07-16 ENCOUNTER — Ambulatory Visit (INDEPENDENT_AMBULATORY_CARE_PROVIDER_SITE_OTHER): Payer: 59 | Admitting: Nurse Practitioner

## 2015-07-16 ENCOUNTER — Encounter: Payer: Self-pay | Admitting: Nurse Practitioner

## 2015-07-16 VITALS — BP 119/75 | HR 87 | Temp 97.8°F | Ht 66.5 in | Wt 168.0 lb

## 2015-07-16 DIAGNOSIS — M542 Cervicalgia: Secondary | ICD-10-CM

## 2015-07-16 DIAGNOSIS — R0602 Shortness of breath: Secondary | ICD-10-CM

## 2015-07-16 DIAGNOSIS — R5383 Other fatigue: Secondary | ICD-10-CM | POA: Diagnosis not present

## 2015-07-16 MED ORDER — METHYLPREDNISOLONE ACETATE 80 MG/ML IJ SUSP
80.0000 mg | Freq: Once | INTRAMUSCULAR | Status: AC
  Administered 2015-07-16: 80 mg via INTRAMUSCULAR

## 2015-07-16 NOTE — Addendum Note (Signed)
Addended by: Chevis Pretty on: 07/16/2015 05:01 PM   Modules accepted: Level of Service

## 2015-07-16 NOTE — Patient Instructions (Signed)
Lyme Disease You may have been bitten by a tick and are to watch for the development of Lyme Disease. Lyme Disease is an infection that is caused by a bacteria The bacteria causing this disease is named Borreilia burgdorferi. If a tick is infected with this bacteria and then bites you, then Lyme Disease may occur. These ticks are carried by deer and rodents such as rabbits and mice and infest grassy as well as forested areas. Fortunately most tick bites do not cause Lyme Disease.  Lyme Disease is easier to prevent than to treat. First, covering your legs with clothing when walking in areas where ticks are possibly abundant will prevent their attachment because ticks tend to stay within inches of the ground. Second, using insecticides containing DEET can be applied on skin or clothing. Last, because it takes about 12 to 24 hours for the tick to transmit the disease after attachment to the human host, you should inspect your body for ticks twice a day when you are in areas where Lyme Disease is common. You must look thoroughly when searching for ticks. The Ixodes tick that carries Lyme Disease is very small. It is around the size of a sesame seed (picture of tick is not actual size). Removal is best done by grasping the tick by the head and pulling it out. Do not to squeeze the body of the tick. This could inject the infecting bacteria into the bite site. Wash the area of the bite with an antiseptic solution after removal.  Lyme Disease is a disease that may affect many body systems. Because of the small size of the biting tick, most people do not notice being bitten. The first sign of an infection is usually a round red rash that extends out from the center of the tick bite. The center of the lesion may be blood colored (hemorrhagic) or have tiny blisters (vesicular). Most lesions have bright red outer borders and partial central clearing. This rash may extend out many inches in diameter, and multiple lesions may  be present. Other symptoms such as fatigue, headaches, chills and fever, general achiness and swelling of lymph glands may also occur. If this first stage of the disease is left untreated, these symptoms may gradually resolve by themselves, or progressive symptoms may occur because of spread of infection to other areas of the body.  Follow up with your caregiver to have testing and treatment if you have a tick bite and you develop any of the above complaints. Your caregiver may recommend preventative (prophylactic) medications which kill bacteria (antibiotics). Once a diagnosis of Lyme Disease is made, antibiotic treatment is highly likely to cure the disease. Effective treatment of late stage Lyme Disease may require longer courses of antibiotic therapy.  MAKE SURE YOU:   Understand these instructions.  Will watch your condition.  Will get help right away if you are not doing well or get worse. Document Released: 03/22/2001 Document Revised: 03/07/2012 Document Reviewed: 05/24/2009 [employer] Patient Information 2015 [employer]. This information is not intended to replace advice given to you by your health care provider. Make sure you discuss any questions you have with your health care provider.  

## 2015-07-16 NOTE — Progress Notes (Signed)
   Subjective:    Patient ID: Morgan Mayo, female    DOB: 07/05/1981, 34 y.o.   MRN: [id number]  HPI Patient in today c/o fatigue, headache and slight SOB- SHe is having neck pain- pain when looking upward and turning head to right- WAs in a cold room over weekend and that seemed to aggravate it. Rates pain 4/10 but has been taking motrin 800mg   Every 6 hours- She says her head is hurting and that is unsusual for her. She has gotten a tick off of herself awhile back. Has occasional sweats but no fever. Had RMSF 3 years ago and she fees similar to that but nit as bad as she did then.    Review of Systems  Constitutional: Negative.   HENT: Negative.   Respiratory: Negative.   Cardiovascular: Negative.   Gastrointestinal: Negative.   Genitourinary: Negative.   Neurological: Negative.   Psychiatric/Behavioral: Negative.   All other systems reviewed and are negative.      Objective:   Physical Exam  Constitutional: She is oriented to person, place, and time. She appears well-developed and well-nourished.  HENT:  Right Ear: External ear normal.  Left Ear: External ear normal.  Nose: Nose normal.  Mouth/Throat: Oropharynx is clear and moist.  Cardiovascular: Normal rate, regular rhythm and normal heart sounds.   Pulmonary/Chest: Effort normal and breath sounds normal.  Musculoskeletal: Normal range of motion.  FROM of cervical spine with pain on extension and rotation. Point tenderness at base of neck bilaterally   Neurological: She is alert and oriented to person, place, and time. She has normal reflexes. No cranial nerve deficit.  Skin: Skin is warm.  Psychiatric: She has a normal mood and affect. Her behavior is normal. Judgment and thought content normal.   BP 119/75 mmHg  Pulse 87  Temp(Src) 97.8 F (36.6 C) (Oral)  Ht 5' 6.5" (1.689 m)  Wt 168 lb (76.204 kg)  BMI 26.71 kg/m2  Chest xray- normal-Preliminary reading by Ronnald Collum, FNP  [facility] Cervical spine xray-  normal-Preliminary reading by Ronnald Collum, FNP  [facility]  Results for orders placed or performed in visit on 07/15/15  POCT CBC  Result Value Ref Range   WBC 6.9 4.6 - 10.2 K/uL   Lymph, poc 1.7 0.6 - 3.4   POC LYMPH PERCENT 25.1 10 - 50 %L   POC Granulocyte 4.9 2 - 6.9   Granulocyte percent 71.3 37 - 80 %G   RBC 5.04 4.04 - 5.48 M/uL   Hemoglobin 13.3 12.2 - 16.2 g/dL   HCT, POC 42.3 37.7 - 47.9 %   MCV 83.9 80 - 97 fL   MCH, POC 26.4 (A) 27 - 31.2 pg   MCHC 31.4 (A) 31.8 - 35.4 g/dL   RDW, POC 13.9 %   Platelet Count, POC 271.0 142 - 424 K/uL   MPV 9.0 0 - 99.8 fL        Assessment & Plan:   1. SOB (shortness of breath)   2. Neck pain   3. Other fatigue    Meds ordered this encounter  Medications  . methylPREDNISolone acetate (DEPO-MEDROL) injection 80 mg    Sig:    Labs pending Will talk once labs are back Motrin OTC q6  RTO Prn  Mary-Margaret Hassell Done, FNP

## 2015-07-17 LAB — ROCKY MTN SPOTTED FVR ABS PNL(IGG+IGM)
RMSF IgG: NEGATIVE
RMSF IgM: 0.79 index (ref 0.00–0.89)

## 2015-07-17 LAB — LYME AB/WESTERN BLOT REFLEX

## 2015-07-19 LAB — SPECIMEN STATUS REPORT

## 2015-08-28 ENCOUNTER — Other Ambulatory Visit (INDEPENDENT_AMBULATORY_CARE_PROVIDER_SITE_OTHER): Payer: 59

## 2015-08-28 DIAGNOSIS — Z01419 Encounter for gynecological examination (general) (routine) without abnormal findings: Secondary | ICD-10-CM | POA: Diagnosis not present

## 2015-08-28 NOTE — Progress Notes (Signed)
Lab work for Dr Louretta Shorten CBC, B12, TSH, CMP, Lipid, Vit D Z01.419

## 2015-08-29 LAB — LIPID PANEL
CHOL/HDL RATIO: 2.2 ratio (ref 0.0–4.4)
Cholesterol, Total: 172 mg/dL (ref 100–199)
HDL: 77 mg/dL (ref >39)
LDL Calculated: 80 mg/dL (ref 0–99)
Triglycerides: 75 mg/dL (ref 0–149)
VLDL Cholesterol Cal: 15 mg/dL (ref 5–40)

## 2015-08-29 LAB — CBC WITH DIFFERENTIAL/PLATELET
BASOS ABS: 0 10*3/uL (ref 0.0–0.2)
Basos: 1 %
EOS (ABSOLUTE): 0.1 10*3/uL (ref 0.0–0.4)
Eos: 1 %
HEMOGLOBIN: 13.6 g/dL (ref 11.1–15.9)
Hematocrit: 42.1 % (ref 34.0–46.6)
IMMATURE GRANULOCYTES: 0 %
Immature Grans (Abs): 0 10*3/uL (ref 0.0–0.1)
LYMPHS: 32 %
Lymphocytes Absolute: 1.4 10*3/uL (ref 0.7–3.1)
MCH: 29.1 pg (ref 26.6–33.0)
MCHC: 32.3 g/dL (ref 31.5–35.7)
MCV: 90 fL (ref 79–97)
MONOCYTES: 7 %
Monocytes Absolute: 0.3 10*3/uL (ref 0.1–0.9)
NEUTROS ABS: 2.6 10*3/uL (ref 1.4–7.0)
NEUTROS PCT: 59 %
PLATELETS: 217 10*3/uL (ref 150–379)
RBC: 4.67 x10E6/uL (ref 3.77–5.28)
RDW: 13.6 % (ref 12.3–15.4)
WBC: 4.4 10*3/uL (ref 3.4–10.8)

## 2015-08-29 LAB — CMP14+EGFR
A/G RATIO: 1.8 (ref 1.1–2.5)
ALT: 17 IU/L (ref 0–32)
AST: 17 IU/L (ref 0–40)
Albumin: 4.4 g/dL (ref 3.5–5.5)
Alkaline Phosphatase: 75 IU/L (ref 39–117)
BILIRUBIN TOTAL: 0.4 mg/dL (ref 0.0–1.2)
BUN/Creatinine Ratio: 19 (ref 8–20)
BUN: 15 mg/dL (ref 6–20)
CHLORIDE: 100 mmol/L (ref 97–108)
CO2: 25 mmol/L (ref 18–29)
Calcium: 9.1 mg/dL (ref 8.7–10.2)
Creatinine, Ser: 0.79 mg/dL (ref 0.57–1.00)
GFR calc Af Amer: 115 mL/min/{1.73_m2} (ref >59)
GFR, EST NON AFRICAN AMERICAN: 99 mL/min/{1.73_m2} (ref >59)
GLOBULIN, TOTAL: 2.5 g/dL (ref 1.5–4.5)
Glucose: 85 mg/dL (ref 65–99)
POTASSIUM: 4.4 mmol/L (ref 3.5–5.2)
SODIUM: 141 mmol/L (ref 134–144)
Total Protein: 6.9 g/dL (ref 6.0–8.5)

## 2015-08-29 LAB — TSH: TSH: 1.98 u[IU]/mL (ref 0.450–4.500)

## 2015-08-29 LAB — VITAMIN D 25 HYDROXY (VIT D DEFICIENCY, FRACTURES): Vit D, 25-Hydroxy: 31.9 ng/mL (ref 30.0–100.0)

## 2015-08-29 LAB — VITAMIN B12: VITAMIN B 12: 278 pg/mL (ref 211–946)

## 2015-10-04 ENCOUNTER — Encounter: Payer: Self-pay | Admitting: Family Medicine

## 2015-10-04 ENCOUNTER — Ambulatory Visit (INDEPENDENT_AMBULATORY_CARE_PROVIDER_SITE_OTHER): Payer: 59 | Admitting: Family Medicine

## 2015-10-04 VITALS — BP 110/72 | HR 83 | Temp 98.0°F | Ht 66.5 in | Wt 165.0 lb

## 2015-10-04 DIAGNOSIS — I493 Ventricular premature depolarization: Secondary | ICD-10-CM

## 2015-10-04 MED ORDER — METOPROLOL TARTRATE 25 MG PO TABS: 12.5000 mg | ORAL_TABLET | Freq: Two times a day (BID) | ORAL | Status: DC

## 2015-10-04 NOTE — Progress Notes (Signed)
 BP 110/72 mmHg  Pulse 83  Temp(Src) 98 F (36.7 C)  Ht 5' 6.5" (1.689 m)  Wt 165 lb (74.844 kg)  BMI 26.24 kg/m2  LMP 09/27/2015   Subjective:    Patient ID: Morgan Mayo, female    DOB: 02/03/1981, 33 y.o.   MRN: [id number]  HPI: Morgan Mayo is a 33 y.o. female presenting on 10/04/2015 for No chief complaint on file.   HPI Palpitations Patient has been having recurrent and daily palpitations. These palpitations are often associated with feelings of lightheadedness and dizziness and occasionally feeling short of breath. She has these palpitations to a point where she feels she can't exercise workout because they make her feel this lightheadedness or dizziness. She denies any chest pains or syncope. Denies any cardiac history.  Relevant past medical, surgical, family and social history reviewed and updated as indicated. Interim medical history since our last visit reviewed. Allergies and medications reviewed and updated.  Review of Systems  Constitutional: Negative for fever and chills.  HENT: Negative for congestion, ear discharge and ear pain.   Eyes: Negative for redness and visual disturbance.  Respiratory: Negative for chest tightness and shortness of breath.   Cardiovascular: Positive for palpitations. Negative for chest pain and leg swelling.  Genitourinary: Negative for dysuria and difficulty urinating.  Musculoskeletal: Negative for back pain and gait problem.  Skin: Negative for rash.  Neurological: Positive for dizziness and light-headedness. Negative for tremors, speech difficulty and headaches.  Psychiatric/Behavioral: Negative for behavioral problems and agitation.  All other systems reviewed and are negative.   Per HPI unless specifically indicated above     Medication List       This list is accurate as of: 10/04/15  3:16 PM.  Always use your most recent med list.               etonogestrel-ethinyl estradiol 0.12-0.015 MG/24HR vaginal ring    Commonly known as:  NUVARING  Place 1 each vaginally every 28 (twenty-eight) days. Insert vaginally and leave in place for 3 consecutive weeks, then remove for 1 week.     metoprolol tartrate 25 MG tablet  Commonly known as:  LOPRESSOR  Take 0.5 tablets (12.5 mg total) by mouth 2 (two) times daily.           Objective:    BP 110/72 mmHg  Pulse 83  Temp(Src) 98 F (36.7 C)  Ht 5' 6.5" (1.689 m)  Wt 165 lb (74.844 kg)  BMI 26.24 kg/m2  LMP 09/27/2015  Wt Readings from Last 3 Encounters:  10/04/15 165 lb (74.844 kg)  07/16/15 168 lb (76.204 kg)  04/04/15 178 lb (80.74 kg)    Physical Exam  Constitutional: She is oriented to person, place, and time. She appears well-developed and well-nourished. No distress.  Eyes: Conjunctivae and EOM are normal. Pupils are equal, round, and reactive to light.  Neck: Neck supple. No thyromegaly present.  Cardiovascular: Normal rate, regular rhythm and normal heart sounds.   No murmur heard. Pulmonary/Chest: Effort normal and breath sounds normal. No respiratory distress. She has no wheezes.  Musculoskeletal: Normal range of motion. She exhibits no edema or tenderness.  Lymphadenopathy:    She has no cervical adenopathy.  Neurological: She is alert and oriented to person, place, and time. Coordination normal.  Skin: Skin is warm and dry. No rash noted. She is not diaphoretic.  Psychiatric: She has a normal mood and affect. Her behavior is normal.  Vitals reviewed.   Results   for orders placed or performed in visit on 08/28/15  Vitamin B12  Result Value Ref Range   Vitamin B-12 278 211 - 946 pg/mL  TSH  Result Value Ref Range   TSH 1.980 0.450 - 4.500 uIU/mL  CMP14+EGFR  Result Value Ref Range   Glucose 85 65 - 99 mg/dL   BUN 15 6 - 20 mg/dL   Creatinine, Ser 0.79 0.57 - 1.00 mg/dL   GFR calc non Af Amer 99 >59 mL/min/1.73   GFR calc Af Amer 115 >59 mL/min/1.73   BUN/Creatinine Ratio 19 8 - 20   Sodium 141 134 - 144 mmol/L    Potassium 4.4 3.5 - 5.2 mmol/L   Chloride 100 97 - 108 mmol/L   CO2 25 18 - 29 mmol/L   Calcium 9.1 8.7 - 10.2 mg/dL   Total Protein 6.9 6.0 - 8.5 g/dL   Albumin 4.4 3.5 - 5.5 g/dL   Globulin, Total 2.5 1.5 - 4.5 g/dL   Albumin/Globulin Ratio 1.8 1.1 - 2.5   Bilirubin Total 0.4 0.0 - 1.2 mg/dL   Alkaline Phosphatase 75 39 - 117 IU/L   AST 17 0 - 40 IU/L   ALT 17 0 - 32 IU/L  Lipid panel  Result Value Ref Range   Cholesterol, Total 172 100 - 199 mg/dL   Triglycerides 75 0 - 149 mg/dL   HDL 77 >39 mg/dL   VLDL Cholesterol Cal 15 5 - 40 mg/dL   LDL Calculated 80 0 - 99 mg/dL   Chol/HDL Ratio 2.2 0.0 - 4.4 ratio units  Vit D  25 hydroxy (rtn osteoporosis monitoring)  Result Value Ref Range   Vit D, 25-Hydroxy 31.9 30.0 - 100.0 ng/mL  CBC with Differential/Platelet  Result Value Ref Range   WBC 4.4 3.4 - 10.8 x10E3/uL   RBC 4.67 3.77 - 5.28 x10E6/uL   Hemoglobin 13.6 11.1 - 15.9 g/dL   Hematocrit 42.1 34.0 - 46.6 %   MCV 90 79 - 97 fL   MCH 29.1 26.6 - 33.0 pg   MCHC 32.3 31.5 - 35.7 g/dL   RDW 13.6 12.3 - 15.4 %   Platelets 217 150 - 379 x10E3/uL   Neutrophils 59 %   Lymphs 32 %   Monocytes 7 %   Eos 1 %   Basos 1 %   Neutrophils Absolute 2.6 1.4 - 7.0 x10E3/uL   Lymphocytes Absolute 1.4 0.7 - 3.1 x10E3/uL   Monocytes Absolute 0.3 0.1 - 0.9 x10E3/uL   EOS (ABSOLUTE) 0.1 0.0 - 0.4 x10E3/uL   Basophils Absolute 0.0 0.0 - 0.2 x10E3/uL   Immature Granulocytes 0 %   Immature Grans (Abs) 0.0 0.0 - 0.1 x10E3/uL      Assessment & Plan:   Problem List Items Addressed This Visit    None    Visit Diagnoses    PVC's (premature ventricular contractions)    -  Primary    Patient has PVCs that are symptomatic for shortness of breath and lightheadedness. They occur daily. We'll send beta blocker to help    Relevant Medications    metoprolol tartrate (LOPRESSOR) 25 MG tablet    Other Relevant Orders    EKG 12-Lead (Completed)        Follow up plan: Return in about 1 year  (around 10/03/2016), or if symptoms worsen or fail to improve.  Joshua Dettinger, MD [facility] 10/04/2015, 3:16 PM      

## 2015-11-26 MED ORDER — AZITHROMYCIN 250 MG PO TABS: ORAL_TABLET | ORAL | Status: DC

## 2016-01-09 ENCOUNTER — Other Ambulatory Visit: Payer: Self-pay | Admitting: *Deleted

## 2016-01-09 MED ORDER — LORATADINE 10 MG PO CAPS: 1.0000 | ORAL_CAPSULE | Freq: Every day | ORAL | Status: DC

## 2016-01-09 MED ORDER — NAPROXEN SODIUM 220 MG PO TABS: 220.0000 mg | ORAL_TABLET | Freq: Two times a day (BID) | ORAL | Status: DC

## 2016-01-09 MED ORDER — ACIDOPHILUS PROBIOTIC BLEND PO TABS: 1.0000 | ORAL_TABLET | Freq: Every day | ORAL | Status: DC

## 2016-01-09 MED ORDER — RANITIDINE HCL 150 MG PO TABS: 150.0000 mg | ORAL_TABLET | Freq: Two times a day (BID) | ORAL | Status: DC

## 2016-03-31 ENCOUNTER — Encounter: Payer: Self-pay | Admitting: *Deleted

## 2016-03-31 ENCOUNTER — Encounter (INDEPENDENT_AMBULATORY_CARE_PROVIDER_SITE_OTHER): Payer: Self-pay

## 2016-04-08 ENCOUNTER — Encounter: Payer: Self-pay | Admitting: Nurse Practitioner

## 2016-04-08 ENCOUNTER — Ambulatory Visit (INDEPENDENT_AMBULATORY_CARE_PROVIDER_SITE_OTHER): Payer: 59 | Admitting: Nurse Practitioner

## 2016-04-08 VITALS — BP 104/69 | HR 92 | Temp 97.9°F | Ht 66.5 in | Wt 176.0 lb

## 2016-04-08 DIAGNOSIS — L989 Disorder of the skin and subcutaneous tissue, unspecified: Secondary | ICD-10-CM | POA: Diagnosis not present

## 2016-04-08 DIAGNOSIS — N898 Other specified noninflammatory disorders of vagina: Secondary | ICD-10-CM

## 2016-04-08 DIAGNOSIS — L293 Anogenital pruritus, unspecified: Secondary | ICD-10-CM

## 2016-04-08 LAB — MICROSCOPIC EXAMINATION

## 2016-04-08 LAB — URINALYSIS, COMPLETE
BILIRUBIN UA: NEGATIVE
GLUCOSE, UA: NEGATIVE
Nitrite, UA: NEGATIVE
PH UA: 5 (ref 5.0–7.5)
PROTEIN UA: NEGATIVE
RBC UA: NEGATIVE
Specific Gravity, UA: 1.02 (ref 1.005–1.030)
UUROB: 0.2 mg/dL (ref 0.2–1.0)

## 2016-04-08 LAB — WET PREP FOR TRICH, YEAST, CLUE
Clue Cell Exam: NEGATIVE
Trichomonas Exam: NEGATIVE
Yeast Exam: NEGATIVE

## 2016-04-08 NOTE — Progress Notes (Signed)
   Subjective:    Patient ID: Morgan Mayo, female    DOB: 1981/06/02, 35 y.o.   MRN: [id number]  HPI Patient comes in c/o perineal irritation and itching- said that  she used a new body wash a few weeks ago which causes an immediate burning sensation when she used it. Has been irritated every since- this occurs off an on- she does use a nuvaring but does not think it has changed.    Review of Systems  Constitutional: Negative.   Respiratory: Negative.   Cardiovascular: Negative.   Genitourinary: Negative.   Neurological: Negative.   Psychiatric/Behavioral: Negative.        Objective:   Physical Exam  Constitutional: She appears well-developed and well-nourished.  Cardiovascular: Normal rate, regular rhythm and normal heart sounds.   Pulmonary/Chest: Effort normal and breath sounds normal.  Genitourinary:  Perineum no examined  Skin: Skin is warm.  Psychiatric: She has a normal mood and affect. Her behavior is normal. Judgment and thought content normal.    BP 104/69 mmHg  Pulse 92  Temp(Src) 97.9 F (36.6 C) (Oral)  Ht 5' 6.5" (1.689 m)  Wt 176 lb (79.833 kg)  BMI 27.98 kg/m2       Assessment & Plan:   1. Vaginal irritation   2. Perineal irritation    Continue nystatin cream Avoid harsh soaps- can only use dove soap No perfumed toilet paper No perfumed detergents on underware On use always brand pads avois scratching if possible  Mary-Margaret Hassell Done, FNP

## 2016-05-18 ENCOUNTER — Encounter: Payer: Self-pay | Admitting: Family Medicine

## 2016-05-18 ENCOUNTER — Ambulatory Visit (INDEPENDENT_AMBULATORY_CARE_PROVIDER_SITE_OTHER): Payer: 59 | Admitting: Family Medicine

## 2016-05-18 VITALS — BP 108/73 | HR 88 | Temp 98.4°F | Ht 66.5 in | Wt 172.0 lb

## 2016-05-18 DIAGNOSIS — T148 Other injury of unspecified body region: Secondary | ICD-10-CM | POA: Diagnosis not present

## 2016-05-18 DIAGNOSIS — S30860A Insect bite (nonvenomous) of lower back and pelvis, initial encounter: Secondary | ICD-10-CM | POA: Diagnosis not present

## 2016-05-18 DIAGNOSIS — W57XXXA Bitten or stung by nonvenomous insect and other nonvenomous arthropods, initial encounter: Secondary | ICD-10-CM

## 2016-05-18 DIAGNOSIS — J019 Acute sinusitis, unspecified: Secondary | ICD-10-CM | POA: Diagnosis not present

## 2016-05-18 DIAGNOSIS — M2550 Pain in unspecified joint: Secondary | ICD-10-CM | POA: Diagnosis not present

## 2016-05-18 MED ORDER — DOXYCYCLINE HYCLATE 100 MG PO TABS: 100.0000 mg | ORAL_TABLET | Freq: Two times a day (BID) | ORAL | Status: DC

## 2016-05-18 NOTE — Patient Instructions (Signed)
Drink plenty of fluids and stay well hydrated Take doxycycline twice daily with food for 2-3 weeks Be more cautious with birth control because this could interfere with birth control pill Take Delsym twice daily Use nasal saline frequently We'll call with results of tick bites and CBC as soon as those results become available

## 2016-05-18 NOTE — Progress Notes (Signed)
Subjective:    Patient ID: Morgan Mayo, female    DOB: 07/08/1981, 35 y.o.   MRN: [id number]  HPI Patient is here today with sinus pain on right side of face, drainage, congestion, and cough for 4 days. She is also complaining with joint pain, low back, hip, knee and shoulder pain. Patient had 3 tick bites about 1 month ago. The patient is especially been feeling bad for the past 2-4 days. The joint pain is been worse for 4 days. The fascial discomfort on the right side has been going on for 2 days and she has had green to yellow drainage and sputum. She's had minimal cough. Patient denies any gastrointestinal symptoms or rash. She may of had a low-grade fever yesterday. She did not check the temperature. She had no nausea vomiting diarrhea.   Review of Systems  Constitutional: Negative.   HENT: Positive for congestion and sinus pressure.   Eyes: Negative.   Respiratory: Positive for cough.   Cardiovascular: Negative.   Gastrointestinal: Negative.   Endocrine: Negative.   Genitourinary: Negative.   Musculoskeletal:       Joint pain low back, hips, knees and shoulder pain.  Skin: Negative.   Allergic/Immunologic: Negative.   Neurological: Negative.   Hematological: Negative.   Psychiatric/Behavioral: Negative.        There are no active problems to display for this patient.  Outpatient Encounter Prescriptions as of 05/18/2016  Medication Sig  . etonogestrel-ethinyl estradiol (NUVARING) 0.12-0.015 MG/24HR vaginal ring Place 1 each vaginally every 28 (twenty-eight) days. Insert vaginally and leave in place for 3 consecutive weeks, then remove for 1 week.  . Loratadine 10 MG CAPS Take 1 capsule (10 mg total) by mouth daily.  . naproxen sodium ([employer] NAPROXEN SODIUM) 220 MG tablet Take 1 tablet (220 mg total) by mouth 2 (two) times daily with a meal.  . Probiotic Product (ACIDOPHILUS PROBIOTIC BLEND) TABS Take 1 tablet by mouth daily.  . ranitidine (ZANTAC) 150 MG tablet Take 1  tablet (150 mg total) by mouth 2 (two) times daily.  . [DISCONTINUED] metoprolol tartrate (LOPRESSOR) 25 MG tablet Take 0.5 tablets (12.5 mg total) by mouth 2 (two) times daily. (Patient not taking: Reported on 05/18/2016)   No facility-administered encounter medications on file as of 05/18/2016.       Objective:   Physical Exam  Constitutional: She is oriented to person, place, and time. She appears well-developed and well-nourished. No distress.  HENT:  Head: Normocephalic and atraumatic.  Right Ear: External ear normal.  Left Ear: External ear normal.  Nose: Nose normal.  Mouth/Throat: Oropharynx is clear and moist.  There is right maxillary pain and ethmoid pain to palpation.  Eyes: Conjunctivae and EOM are normal. Pupils are equal, round, and reactive to light. Right eye exhibits no discharge. Left eye exhibits no discharge. No scleral icterus.  Neck: Normal range of motion. Neck supple. No JVD present. No thyromegaly present.  There is an anterior cervical node on the right side. This is tender to palpation.  Cardiovascular: Normal rate, regular rhythm, normal heart sounds and intact distal pulses.   No murmur heard. Pulmonary/Chest: Effort normal and breath sounds normal. No respiratory distress. She has no wheezes. She has no rales. She exhibits no tenderness.  Slight upper airway congestion with coughing  Musculoskeletal: Normal range of motion. She exhibits no edema or tenderness.  Lymphadenopathy:    She has cervical adenopathy.  Neurological: She is alert and oriented to person, place, and time.  Skin: Skin is warm and dry. No rash noted.  Psychiatric: She has a normal mood and affect. Her behavior is normal. Judgment and thought content normal.  The patient just feels bad.  Nursing note and vitals reviewed.   BP 108/73 mmHg  Pulse 88  Temp(Src) 98.4 F (36.9 C) (Oral)  Ht 5' 6.5" (1.689 m)  Wt 172 lb (78.019 kg)  BMI 27.35 kg/m2       Assessment & Plan:  1.  Tick bite - Lyme Ab/Western Blot Reflex - Rocky mtn spotted fvr abs pnl(IgG+IgM) - CBC with Differential/Platelet - BMP8+EGFR - Sedimentation rate - doxycycline (VIBRA-TABS) 100 MG tablet; Take 1 tablet (100 mg total) by mouth 2 (two) times daily.  Dispense: 60 tablet; Refill: 0  2. Acute rhinosinusitis -Use nasal saline and take Delsym twice daily for cough and congestion - doxycycline (VIBRA-TABS) 100 MG tablet; Take 1 tablet (100 mg total) by mouth 2 (two) times daily.  Dispense: 60 tablet; Refill: 0  Patient Instructions  Drink plenty of fluids and stay well hydrated Take doxycycline twice daily with food for 2-3 weeks Be more cautious with birth control because this could interfere with birth control pill Take Delsym twice daily Use nasal saline frequently We'll call with results of tick bites and CBC as soon as those results become available   Arrie Senate MD

## 2016-05-19 LAB — LYME AB/WESTERN BLOT REFLEX: LYME DISEASE AB, QUANT, IGM: <0.8 index (ref 0.00–0.79)

## 2016-05-19 LAB — CBC WITH DIFFERENTIAL/PLATELET
BASOS: 0 %
Basophils Absolute: 0 10*3/uL (ref 0.0–0.2)
EOS (ABSOLUTE): 0.1 10*3/uL (ref 0.0–0.4)
EOS: 1 %
HEMATOCRIT: 40.6 % (ref 34.0–46.6)
Hemoglobin: 13.2 g/dL (ref 11.1–15.9)
IMMATURE GRANS (ABS): 0 10*3/uL (ref 0.0–0.1)
IMMATURE GRANULOCYTES: 0 %
LYMPHS: 16 %
Lymphocytes Absolute: 1 10*3/uL (ref 0.7–3.1)
MCH: 28.1 pg (ref 26.6–33.0)
MCHC: 32.5 g/dL (ref 31.5–35.7)
MCV: 87 fL (ref 79–97)
Monocytes Absolute: 0.4 10*3/uL (ref 0.1–0.9)
Monocytes: 7 %
NEUTROS PCT: 76 %
Neutrophils Absolute: 4.6 10*3/uL (ref 1.4–7.0)
PLATELETS: 206 10*3/uL (ref 150–379)
RBC: 4.69 x10E6/uL (ref 3.77–5.28)
RDW: 13.5 % (ref 12.3–15.4)
WBC: 6.1 10*3/uL (ref 3.4–10.8)

## 2016-05-19 LAB — BMP8+EGFR
BUN/Creatinine Ratio: 18 (ref 9–23)
BUN: 15 mg/dL (ref 6–20)
CALCIUM: 8.8 mg/dL (ref 8.7–10.2)
CHLORIDE: 104 mmol/L (ref 96–106)
CO2: 19 mmol/L (ref 18–29)
Creatinine, Ser: 0.82 mg/dL (ref 0.57–1.00)
GFR calc non Af Amer: 94 mL/min/{1.73_m2} (ref >59)
GFR, EST AFRICAN AMERICAN: 109 mL/min/{1.73_m2} (ref >59)
Glucose: 91 mg/dL (ref 65–99)
POTASSIUM: 4.7 mmol/L (ref 3.5–5.2)
Sodium: 143 mmol/L (ref 134–144)

## 2016-05-19 LAB — ROCKY MTN SPOTTED FVR ABS PNL(IGG+IGM)
RMSF IgG: NEGATIVE
RMSF IgM: 1.21 index — ABNORMAL HIGH (ref 0.00–0.89)

## 2016-05-19 LAB — SEDIMENTATION RATE: Sed Rate: 11 mm/hr (ref 0–32)

## 2016-06-04 DIAGNOSIS — Z01419 Encounter for gynecological examination (general) (routine) without abnormal findings: Secondary | ICD-10-CM | POA: Diagnosis not present

## 2016-06-04 DIAGNOSIS — Z6827 Body mass index (BMI) 27.0-27.9, adult: Secondary | ICD-10-CM | POA: Diagnosis not present

## 2016-07-17 ENCOUNTER — Ambulatory Visit (INDEPENDENT_AMBULATORY_CARE_PROVIDER_SITE_OTHER): Payer: 59 | Admitting: Nurse Practitioner

## 2016-07-17 ENCOUNTER — Encounter: Payer: Self-pay | Admitting: Nurse Practitioner

## 2016-07-17 VITALS — BP 118/82 | HR 87 | Temp 97.4°F | Ht 66.0 in | Wt 172.0 lb

## 2016-07-17 DIAGNOSIS — B354 Tinea corporis: Secondary | ICD-10-CM | POA: Diagnosis not present

## 2016-07-17 DIAGNOSIS — R21 Rash and other nonspecific skin eruption: Secondary | ICD-10-CM | POA: Diagnosis not present

## 2016-07-17 MED ORDER — TRIAMCINOLONE ACETONIDE 0.1 % EX OINT: 1.0000 "application " | TOPICAL_OINTMENT | Freq: Two times a day (BID) | CUTANEOUS | Status: DC

## 2016-07-17 MED ORDER — TERBINAFINE HCL 250 MG PO TABS: 250.0000 mg | ORAL_TABLET | Freq: Every day | ORAL | Status: DC

## 2016-07-17 NOTE — Progress Notes (Signed)
   Subjective:    Patient ID: Morgan Mayo, female    DOB: 12-15-1981, 35 y.o.   MRN: [id number]  HPI Patient comes in with itchy dry rash on right foot- comes and goes- itches real bad.    Review of Systems  Constitutional: Negative.   Respiratory: Negative.   Cardiovascular: Negative.   Genitourinary: Negative.   Neurological: Negative.   Psychiatric/Behavioral: Negative.   All other systems reviewed and are negative.      Objective:   Physical Exam  Constitutional: She is oriented to person, place, and time. She appears well-developed and well-nourished.  Cardiovascular: Normal rate, regular rhythm and normal heart sounds.   Pulmonary/Chest: Effort normal and breath sounds normal.  Neurological: She is alert and oriented to person, place, and time.  Skin: Skin is warm and dry.  Patchy small maculopapular lesions on arch of right foot.  Psychiatric: She has a normal mood and affect. Her behavior is normal. Judgment and thought content normal.    BP 118/82 mmHg  Pulse 87  Temp(Src) 97.4 F (36.3 C) (Oral)  Ht 5\' 6"  (1.676 m)  Wt 172 lb (78.019 kg)  BMI 27.77 kg/m2       Assessment & Plan:   1. Rash and nonspecific skin eruption   2. Tinea corporis    Meds ordered this encounter  Medications  . triamcinolone ointment (KENALOG) 0.1 %    Sig: Apply 1 application topically 2 (two) times daily.    Dispense:  30 g    Refill:  0    Order Specific Question:  Supervising Provider    Answer:  VINCENT, CAROL L [[id number]]  . terbinafine (LAMISIL) 250 MG tablet    Sig: Take 1 tablet (250 mg total) by mouth daily.    Dispense:  14 tablet    Refill:  0    Order Specific Question:  Supervising Provider    Answer:  Eustaquio Maize [[id number]]   Avoid scratching RTO prn  Mary-Margaret Hassell Done, FNP

## 2016-08-12 ENCOUNTER — Ambulatory Visit (INDEPENDENT_AMBULATORY_CARE_PROVIDER_SITE_OTHER): Payer: 59

## 2016-08-12 ENCOUNTER — Ambulatory Visit (INDEPENDENT_AMBULATORY_CARE_PROVIDER_SITE_OTHER): Payer: 59 | Admitting: Family Medicine

## 2016-08-12 ENCOUNTER — Encounter: Payer: Self-pay | Admitting: Family Medicine

## 2016-08-12 VITALS — BP 114/75 | HR 94 | Temp 97.1°F | Ht 66.0 in | Wt 175.0 lb

## 2016-08-12 DIAGNOSIS — M25522 Pain in left elbow: Secondary | ICD-10-CM

## 2016-08-12 MED ORDER — MELOXICAM 7.5 MG PO TABS: 7.5000 mg | ORAL_TABLET | Freq: Every day | ORAL | 0 refills | Status: DC

## 2016-08-12 NOTE — Addendum Note (Signed)
Addended by: Thana Ates on: 08/12/2016 01:38 PM   Modules accepted: Orders

## 2016-08-12 NOTE — Progress Notes (Signed)
Subjective:    Patient ID: Morgan Mayo, female    DOB: January 04, 1981, 35 y.o.   MRN: [id number]  HPI Patient here today for left elbow pain. Started after a fall 3 years ago has progressively worsened.    There are no active problems to display for this patient.  Outpatient Encounter Prescriptions as of 08/12/2016  Medication Sig  . etonogestrel-ethinyl estradiol (NUVARING) 0.12-0.015 MG/24HR vaginal ring Place 1 each vaginally every 28 (twenty-eight) days. Insert vaginally and leave in place for 3 consecutive weeks, then remove for 1 week.  . Loratadine 10 MG CAPS Take 1 capsule (10 mg total) by mouth daily.  . [DISCONTINUED] Probiotic Product (ACIDOPHILUS PROBIOTIC BLEND) TABS Take 1 tablet by mouth daily. (Patient not taking: Reported on 08/12/2016)  . [DISCONTINUED] terbinafine (LAMISIL) 250 MG tablet Take 1 tablet (250 mg total) by mouth daily. (Patient not taking: Reported on 08/12/2016)  . [DISCONTINUED] triamcinolone ointment (KENALOG) 0.1 % Apply 1 application topically 2 (two) times daily. (Patient not taking: Reported on 08/12/2016)   No facility-administered encounter medications on file as of 08/12/2016.        Review of Systems  Constitutional: Negative.   HENT: Negative.   Eyes: Negative.   Respiratory: Negative.   Cardiovascular: Negative.   Gastrointestinal: Negative.   Endocrine: Negative.   Genitourinary: Negative.   Musculoskeletal: Positive for arthralgias (left elbow pain).  Skin: Negative.   Allergic/Immunologic: Negative.   Neurological: Negative.   Hematological: Negative.   Psychiatric/Behavioral: Negative.   All other systems reviewed and are negative.      Objective:   Physical Exam  Constitutional: She is oriented to person, place, and time. She appears well-developed.  HENT:  Head: Normocephalic.  Eyes: Conjunctivae and EOM are normal. Pupils are equal, round, and reactive to light.  Musculoskeletal: Normal range of motion. She exhibits  tenderness. She exhibits no edema.  There is no lateral epicondyles tenderness. There is slight medial epicondyles tenderness. The pain that she has though is not in either of these locations and comes sporadically. This is happening since she had a fall several years ago. The x-ray which is Re: Been read is negative.  Neurological: She is alert and oriented to person, place, and time.  Skin: No rash noted.  Psychiatric: She has a normal mood and affect. Her behavior is normal. Judgment and thought content normal.  Nursing note and vitals reviewed.   BP 114/75 (BP Location: Right Arm, Patient Position: Sitting, Cuff Size: Normal)   Pulse 94   Temp 97.1 F (36.2 C)   Ht 5\' 6"  (1.676 m)   Wt 175 lb (79.4 kg)   LMP 07/21/2016   BMI 28.25 kg/m   [facility] reading (PRIMARY) by  DrMoore-left elbow read as normal by radiologist, patient is aware.                                       Assessment & Plan:  1. Left elbow pain - DG Elbow Complete Left -Take meloxicam 7.5 mg 1 daily after eating for 2 weeks and then repeat check elbow symptoms at that time  Patient Instructions  Take meloxicam 7.5 mg regularly one daily after eating for 2 weeks and see if the frequency and severity of the pain diminished. Also if possible use warm wet compresses to the elbow 20 minutes 2 or 3 times daily. If the pain persist we will consider  getting an MRI or having one of the orthopedic surgeons to look at the elbow when they visit here.  Arrie Senate MD

## 2016-08-12 NOTE — Patient Instructions (Addendum)
Take meloxicam 7.5 mg regularly one daily after eating for 2 weeks and see if the frequency and severity of the pain diminished. Also if possible use warm wet compresses to the elbow 20 minutes 2 or 3 times daily. If the pain persist we will consider getting an MRI or having one of the orthopedic surgeons to look at the elbow when they visit here.

## 2016-11-09 ENCOUNTER — Encounter: Payer: Self-pay | Admitting: Physician Assistant

## 2016-11-09 ENCOUNTER — Ambulatory Visit (INDEPENDENT_AMBULATORY_CARE_PROVIDER_SITE_OTHER): Payer: 59 | Admitting: Physician Assistant

## 2016-11-09 VITALS — BP 113/70 | HR 86 | Temp 98.0°F | Ht 66.0 in | Wt 178.0 lb

## 2016-11-09 DIAGNOSIS — J0110 Acute frontal sinusitis, unspecified: Secondary | ICD-10-CM

## 2016-11-09 MED ORDER — AZITHROMYCIN 250 MG PO TABS: ORAL_TABLET | ORAL | 0 refills | Status: DC

## 2016-11-09 NOTE — Progress Notes (Signed)
BP 113/70   Pulse 86   Temp 98 F (36.7 C) (Oral)   Ht 5\' 6"  (1.676 m)   Wt 178 lb (80.7 kg)   BMI 28.73 kg/m    Subjective:    Patient ID: Morgan Mayo, female    DOB: 21-Dec-1981, 35 y.o.   MRN: [id number]  HPI: Morgan Mayo is a 35 y.o. female presenting on 11/09/2016 for Sinusitis and facial pressure  Almost one Week of increasing sinus pressure. She woke up last night with severe pain over the maxillary sinuses. She even feels puffy and swollen up below her eyes. She did see blood a few days ago with copious clot she was able to get out. At this point her mucus is also very bright green. She denies any severe cough or wheezing at this time. No fever or chills.  Relevant past medical, surgical, family and social history reviewed and updated as indicated. Allergies and medications reviewed and updated.  Past Medical History:  Diagnosis Date  . Abnormal Pap smear     Past Surgical History:  Procedure Laterality Date  . belly button      Review of Systems  Constitutional: Positive for fatigue. Negative for chills and fever.  HENT: Positive for congestion, postnasal drip, sinus pain, sinus pressure and sore throat.   Eyes: Negative.   Respiratory: Negative for cough, shortness of breath and wheezing.   Cardiovascular: Negative.   Gastrointestinal: Negative.   Genitourinary: Negative.   Neurological: Positive for headaches.      Medication List       Accurate as of 11/09/16 12:21 PM. Always use your most recent med list.          azithromycin 250 MG tablet Commonly known as:  ZITHROMAX Z-PAK As directed   etonogestrel-ethinyl estradiol 0.12-0.015 MG/24HR vaginal ring Commonly known as:  [place] 1 each vaginally every 28 (twenty-eight) days. Insert vaginally and leave in place for 3 consecutive weeks, then remove for 1 week.          Objective:    BP 113/70   Pulse 86   Temp 98 F (36.7 C) (Oral)   Ht 5\' 6"  (1.676 m)   Wt 178 lb (80.7  kg)   BMI 28.73 kg/m   No Known Allergies  Physical Exam  Constitutional: She is oriented to person, place, and time. She appears well-developed and well-nourished.  HENT:  Head: Normocephalic and atraumatic.  Right Ear: Tympanic membrane and external ear normal. No middle ear effusion.  Left Ear: Tympanic membrane and external ear normal.  No middle ear effusion.  Nose: Mucosal edema and rhinorrhea present. Right sinus exhibits maxillary sinus tenderness. Right sinus exhibits no frontal sinus tenderness. Left sinus exhibits maxillary sinus tenderness. Left sinus exhibits no frontal sinus tenderness.  Mouth/Throat: Uvula is midline. Posterior oropharyngeal erythema present.  Eyes: Conjunctivae and EOM are normal. Pupils are equal, round, and reactive to light. Right eye exhibits no discharge. Left eye exhibits no discharge.  Neck: Normal range of motion.  Cardiovascular: Normal rate, regular rhythm and normal heart sounds.   Pulmonary/Chest: Effort normal and breath sounds normal. No respiratory distress. She has no wheezes.  Abdominal: Soft.  Lymphadenopathy:    She has no cervical adenopathy.  Neurological: She is alert and oriented to person, place, and time.  Skin: Skin is warm and dry.  Psychiatric: She has a normal mood and affect.  Nursing note and vitals reviewed.       Assessment &  Plan:   1. Acute non-recurrent frontal sinusitis - azithromycin (ZITHROMAX Z-PAK) 250 MG tablet; As directed  Dispense: 6 tablet; Refill: 0   Continue all other maintenance medications as listed above.  Follow up plan: Return if symptoms worsen or fail to improve.  Educational handout given for sinusitis  Terald Sleeper PA-C [place] [address]  [place], [place] [address] [phone]   11/09/2016, 12:21 PM

## 2016-12-17 DIAGNOSIS — D225 Melanocytic nevi of trunk: Secondary | ICD-10-CM | POA: Diagnosis not present

## 2016-12-18 DIAGNOSIS — B078 Other viral warts: Secondary | ICD-10-CM | POA: Diagnosis not present

## 2016-12-18 DIAGNOSIS — D225 Melanocytic nevi of trunk: Secondary | ICD-10-CM | POA: Diagnosis not present

## 2016-12-18 DIAGNOSIS — L918 Other hypertrophic disorders of the skin: Secondary | ICD-10-CM | POA: Diagnosis not present

## 2016-12-18 DIAGNOSIS — Z1283 Encounter for screening for malignant neoplasm of skin: Secondary | ICD-10-CM | POA: Diagnosis not present

## 2016-12-18 DIAGNOSIS — L821 Other seborrheic keratosis: Secondary | ICD-10-CM | POA: Diagnosis not present

## 2017-04-15 ENCOUNTER — Other Ambulatory Visit: Payer: Self-pay

## 2017-04-15 MED ORDER — LORATADINE 10 MG PO TABS: 10.0000 mg | ORAL_TABLET | Freq: Every day | ORAL | 3 refills | Status: DC

## 2017-05-04 ENCOUNTER — Other Ambulatory Visit: Payer: Self-pay

## 2017-05-04 MED ORDER — DOXYCYCLINE HYCLATE 100 MG PO TABS: 100.0000 mg | ORAL_TABLET | Freq: Two times a day (BID) | ORAL | 0 refills | Status: DC

## 2017-05-23 ENCOUNTER — Other Ambulatory Visit: Payer: Self-pay | Admitting: Nurse Practitioner

## 2017-05-23 MED ORDER — ERYTHROMYCIN 5 MG/GM OP OINT: 1.0000 "application " | TOPICAL_OINTMENT | Freq: Every day | OPHTHALMIC | 0 refills | Status: DC

## 2017-06-17 DIAGNOSIS — Z01419 Encounter for gynecological examination (general) (routine) without abnormal findings: Secondary | ICD-10-CM | POA: Diagnosis not present

## 2017-06-17 DIAGNOSIS — Z6828 Body mass index (BMI) 28.0-28.9, adult: Secondary | ICD-10-CM | POA: Diagnosis not present

## 2017-07-01 DIAGNOSIS — Z0100 Encounter for examination of eyes and vision without abnormal findings: Secondary | ICD-10-CM | POA: Diagnosis not present

## 2017-09-20 ENCOUNTER — Other Ambulatory Visit: Payer: Self-pay

## 2017-09-20 MED ORDER — AZITHROMYCIN 250 MG PO TABS: ORAL_TABLET | ORAL | 0 refills | Status: DC

## 2017-11-25 DIAGNOSIS — Z3189 Encounter for other procreative management: Secondary | ICD-10-CM | POA: Diagnosis not present

## 2017-11-30 ENCOUNTER — Other Ambulatory Visit: Payer: Self-pay

## 2017-11-30 MED ORDER — MELOXICAM 15 MG PO TABS: ORAL_TABLET | ORAL | 0 refills | Status: DC

## 2018-01-26 ENCOUNTER — Ambulatory Visit (INDEPENDENT_AMBULATORY_CARE_PROVIDER_SITE_OTHER): Payer: 59 | Admitting: Physician Assistant

## 2018-01-26 ENCOUNTER — Encounter: Payer: Self-pay | Admitting: Physician Assistant

## 2018-01-26 VITALS — BP 124/73 | HR 87 | Temp 97.4°F | Ht 66.0 in | Wt 182.2 lb

## 2018-01-26 DIAGNOSIS — J0110 Acute frontal sinusitis, unspecified: Secondary | ICD-10-CM

## 2018-01-26 DIAGNOSIS — H10021 Other mucopurulent conjunctivitis, right eye: Secondary | ICD-10-CM | POA: Diagnosis not present

## 2018-01-26 MED ORDER — AZITHROMYCIN 250 MG PO TABS: ORAL_TABLET | ORAL | 0 refills | Status: DC

## 2018-01-26 MED ORDER — FLUCONAZOLE 150 MG PO TABS: ORAL_TABLET | ORAL | 0 refills | Status: DC

## 2018-01-26 MED ORDER — ERYTHROMYCIN 5 MG/GM OP OINT: 1.0000 "application " | TOPICAL_OINTMENT | Freq: Three times a day (TID) | OPHTHALMIC | 0 refills | Status: DC

## 2018-01-26 NOTE — Progress Notes (Signed)
BP 124/73   Pulse 87   Temp (!) 97.4 F (36.3 C) (Oral)   Ht 5\' 6"  (1.676 m)   Wt 182 lb 3.2 oz (82.6 kg)   BMI 29.41 kg/m    Subjective:    Patient ID: Morgan Mayo, female    DOB: Jan 20, 1981, 37 y.o.   MRN: [id number]  HPI: Morgan Mayo is a 37 y.o. female presenting on 01/26/2018 for Sinusitis and Conjunctivitis  This patient has had many days of sinus headache and postnasal drainage. There is copious drainage at times. Denies any fever at this time. There has been a history of sinus infections in the past.  No history of sinus surgery. There is cough at night. It has become more prevalent in recent days.  This morning the patient woke up with her right eye crusted shut.  There is slight redness to it.  She states it feels warm.  She does not have any pain.  Relevant past medical, surgical, family and social history reviewed and updated as indicated. Allergies and medications reviewed and updated.  Past Medical History:  Diagnosis Date  . Abnormal Pap smear     Past Surgical History:  Procedure Laterality Date  . belly button      Review of Systems  Constitutional: Positive for chills and fatigue. Negative for activity change, appetite change and fever.  HENT: Positive for congestion, postnasal drip, sinus pressure, sinus pain and sore throat.   Eyes: Positive for discharge and redness. Negative for pain.  Respiratory: Positive for cough. Negative for shortness of breath and wheezing.   Cardiovascular: Negative.  Negative for chest pain, palpitations and leg swelling.  Gastrointestinal: Negative.   Genitourinary: Negative.   Musculoskeletal: Negative.   Skin: Negative.   Neurological: Positive for headaches.    Allergies as of 01/26/2018   No Known Allergies     Medication List        Accurate as of 01/26/18  8:10 AM. Always use your most recent med list.          azithromycin 250 MG tablet Commonly known as:  ZITHROMAX Take as directed     erythromycin ophthalmic ointment Place 1 application into the right eye 3 (three) times daily.   etonogestrel-ethinyl estradiol 0.12-0.015 MG/24HR vaginal ring Commonly known as:  [place] 1 each vaginally every 28 (twenty-eight) days. Insert vaginally and leave in place for 3 consecutive weeks, then remove for 1 week.   fluconazole 150 MG tablet Commonly known as:  DIFLUCAN 1 po q week x 4 weeks   loratadine 10 MG tablet Commonly known as:  CLARITIN Take 1 tablet (10 mg total) by mouth daily.   meloxicam 15 MG tablet Commonly known as:  MOBIC 1/2 - 1 tablet daily as needed          Objective:    BP 124/73   Pulse 87   Temp (!) 97.4 F (36.3 C) (Oral)   Ht 5\' 6"  (1.676 m)   Wt 182 lb 3.2 oz (82.6 kg)   BMI 29.41 kg/m   No Known Allergies  Physical Exam  Constitutional: She is oriented to person, place, and time. She appears well-developed and well-nourished.  HENT:  Head: Normocephalic and atraumatic.  Right Ear: Tympanic membrane and external ear normal. No middle ear effusion.  Left Ear: Tympanic membrane and external ear normal.  No middle ear effusion.  Nose: Mucosal edema and rhinorrhea present. Right sinus exhibits no maxillary sinus tenderness. Left sinus exhibits  no maxillary sinus tenderness.  Mouth/Throat: Uvula is midline. Posterior oropharyngeal erythema present.  Eyes: EOM are normal. Pupils are equal, round, and reactive to light. Right eye exhibits no discharge. Left eye exhibits no discharge and no exudate. Right conjunctiva is injected.  Neck: Normal range of motion.  Cardiovascular: Normal rate, regular rhythm and normal heart sounds.  Pulmonary/Chest: Effort normal and breath sounds normal. No respiratory distress. She has no wheezes.  Abdominal: Soft.  Lymphadenopathy:    She has no cervical adenopathy.  Neurological: She is alert and oriented to person, place, and time.  Skin: Skin is warm and dry.  Psychiatric: She has a normal mood  and affect.        Assessment & Plan:   1. Acute non-recurrent frontal sinusitis - azithromycin (ZITHROMAX) 250 MG tablet; Take as directed  Dispense: 6 each; Refill: 0 - fluconazole (DIFLUCAN) 150 MG tablet; 1 po q week x 4 weeks  Dispense: 4 tablet; Refill: 0  2. Pink eye disease of right eye - erythromycin ophthalmic ointment; Place 1 application into the right eye 3 (three) times daily.  Dispense: 3.5 g; Refill: 0    Current Outpatient Medications:  .  azithromycin (ZITHROMAX) 250 MG tablet, Take as directed, Disp: 6 each, Rfl: 0 .  erythromycin ophthalmic ointment, Place 1 application into the right eye 3 (three) times daily., Disp: 3.5 g, Rfl: 0 .  etonogestrel-ethinyl estradiol (NUVARING) 0.12-0.015 MG/24HR vaginal ring, Place 1 each vaginally every 28 (twenty-eight) days. Insert vaginally and leave in place for 3 consecutive weeks, then remove for 1 week., Disp: , Rfl:  .  fluconazole (DIFLUCAN) 150 MG tablet, 1 po q week x 4 weeks, Disp: 4 tablet, Rfl: 0 .  loratadine (CLARITIN) 10 MG tablet, Take 1 tablet (10 mg total) by mouth daily., Disp: 90 tablet, Rfl: 3 .  meloxicam (MOBIC) 15 MG tablet, 1/2 - 1 tablet daily as needed, Disp: 90 tablet, Rfl: 0 Continue all other maintenance medications as listed above.  Follow up plan: No Follow-up on file.  Educational handout given for [place] PA-C [place] [place] [address]  [place], [place] [address] [phone]   01/26/2018, 8:10 AM

## 2018-04-11 ENCOUNTER — Ambulatory Visit (INDEPENDENT_AMBULATORY_CARE_PROVIDER_SITE_OTHER): Payer: 59 | Admitting: Physician Assistant

## 2018-04-11 DIAGNOSIS — J0110 Acute frontal sinusitis, unspecified: Secondary | ICD-10-CM

## 2018-04-11 MED ORDER — AZITHROMYCIN 250 MG PO TABS: ORAL_TABLET | ORAL | 0 refills | Status: DC

## 2018-04-11 MED ORDER — METHYLPREDNISOLONE ACETATE 80 MG/ML IJ SUSP
80.0000 mg | Freq: Once | INTRAMUSCULAR | Status: AC
  Administered 2018-04-11: 80 mg via INTRAMUSCULAR

## 2018-04-11 NOTE — Patient Instructions (Signed)
In a few days you may receive a survey in the mail or online from [employer] regarding your visit with us today. Please take a moment to fill this out. Your feedback is very important to our whole office. It can help us better understand your needs as well as improve your experience and satisfaction. Thank you for taking your time to complete it. We care about you.  Amery Vandenbos, PA-C  

## 2018-04-11 NOTE — Progress Notes (Signed)
BP 117/72   Pulse 92   Temp (!) 97.2 F (36.2 C) (Oral)   Ht 5\' 6"  (1.676 m)   Wt 185 lb (83.9 kg)   BMI 29.86 kg/m    Subjective:    Patient ID: Morgan Mayo, female    DOB: September 06, 1981, 37 y.o.   MRN: [id number]  HPI: Morgan Mayo is a 37 y.o. female presenting on 04/11/2018 for seasonal allergies (congestion ) and right ear pain (jaw pain)  This patient has had many days of sinus headache and postnasal drainage. There is copious drainage at times. Denies any fever at this time. There has been a history of sinus infections in the past.  No history of sinus surgery. There is cough at night. It has become more prevalent in recent days.   Past Medical History:  Diagnosis Date  . Abnormal Pap smear    Relevant past medical, surgical, family and social history reviewed and updated as indicated. Interim medical history since our last visit reviewed. Allergies and medications reviewed and updated. DATA REVIEWED: CHART IN EPIC  Family History reviewed for pertinent findings.  Review of Systems  Constitutional: Positive for fatigue. Negative for activity change, appetite change, chills and fever.  HENT: Positive for congestion, postnasal drip, sinus pressure, sinus pain and sore throat.   Eyes: Negative.   Respiratory: Positive for cough. Negative for wheezing.   Cardiovascular: Negative.  Negative for chest pain, palpitations and leg swelling.  Gastrointestinal: Negative.   Genitourinary: Negative.   Musculoskeletal: Negative.   Skin: Negative.   Neurological: Positive for headaches.    Allergies as of 04/11/2018   No Known Allergies     Medication List        Accurate as of 04/11/18  8:53 AM. Always use your most recent med list.          azithromycin 250 MG tablet Commonly known as:  ZITHROMAX Take as directed   etonogestrel-ethinyl estradiol 0.12-0.015 MG/24HR vaginal ring Commonly known as:  [place] 1 each vaginally every 28 (twenty-eight) days.  Insert vaginally and leave in place for 3 consecutive weeks, then remove for 1 week.   loratadine 10 MG tablet Commonly known as:  CLARITIN Take 1 tablet (10 mg total) by mouth daily.   meloxicam 15 MG tablet Commonly known as:  MOBIC 1/2 - 1 tablet daily as needed          Objective:    BP 117/72   Pulse 92   Temp (!) 97.2 F (36.2 C) (Oral)   Ht 5\' 6"  (1.676 m)   Wt 185 lb (83.9 kg)   BMI 29.86 kg/m   No Known Allergies  Wt Readings from Last 3 Encounters:  04/11/18 185 lb (83.9 kg)  01/26/18 182 lb 3.2 oz (82.6 kg)  11/09/16 178 lb (80.7 kg)    Physical Exam  Constitutional: She is oriented to person, place, and time. She appears well-developed and well-nourished.  HENT:  Head: Normocephalic and atraumatic.  Right Ear: Tympanic membrane and external ear normal. No middle ear effusion.  Left Ear: Tympanic membrane and external ear normal.  No middle ear effusion.  Nose: Mucosal edema and rhinorrhea present. Right sinus exhibits no maxillary sinus tenderness. Left sinus exhibits no maxillary sinus tenderness.  Mouth/Throat: Uvula is midline. Posterior oropharyngeal erythema present.  Eyes: Pupils are equal, round, and reactive to light. Conjunctivae and EOM are normal. Right eye exhibits no discharge. Left eye exhibits no discharge.  Neck: Normal range of  motion.  Cardiovascular: Normal rate, regular rhythm and normal heart sounds.  Pulmonary/Chest: Effort normal and breath sounds normal. No respiratory distress. She has no wheezes.  Abdominal: Soft.  Lymphadenopathy:    She has no cervical adenopathy.  Neurological: She is alert and oriented to person, place, and time.  Skin: Skin is warm and dry.  Psychiatric: She has a normal mood and affect.        Assessment & Plan:   1. Acute non-recurrent frontal sinusitis - azithromycin (ZITHROMAX) 250 MG tablet; Take as directed  Dispense: 6 each; Refill: 0 - methylPREDNISolone acetate (DEPO-MEDROL) injection 80  mg   Continue all other maintenance medications as listed above.  Follow up plan: No follow-ups on file.  Educational handout given for [place] PA-C [place] [address]  [place], [place] [address] [phone]   04/11/2018, 8:53 AM

## 2018-07-03 ENCOUNTER — Other Ambulatory Visit: Payer: Self-pay | Admitting: Family

## 2018-07-03 MED ORDER — ETONOGESTREL-ETHINYL ESTRADIOL 0.12-0.015 MG/24HR VA RING: VAGINAL_RING | VAGINAL | 1 refills | Status: AC

## 2018-07-03 MED ORDER — NORGESTIMATE-ETH ESTRADIOL 0.25-35 MG-MCG PO TABS: 1.0000 | ORAL_TABLET | Freq: Every day | ORAL | 11 refills | Status: DC

## 2018-07-11 DIAGNOSIS — Z0100 Encounter for examination of eyes and vision without abnormal findings: Secondary | ICD-10-CM | POA: Diagnosis not present

## 2018-07-28 DIAGNOSIS — Z01419 Encounter for gynecological examination (general) (routine) without abnormal findings: Secondary | ICD-10-CM | POA: Diagnosis not present

## 2018-07-28 DIAGNOSIS — Z6829 Body mass index (BMI) 29.0-29.9, adult: Secondary | ICD-10-CM | POA: Diagnosis not present

## 2018-12-23 ENCOUNTER — Ambulatory Visit (INDEPENDENT_AMBULATORY_CARE_PROVIDER_SITE_OTHER): Payer: 59 | Admitting: Family

## 2018-12-23 ENCOUNTER — Encounter: Payer: Self-pay | Admitting: Family

## 2018-12-23 VITALS — BP 129/86 | HR 97 | Temp 97.8°F | Ht 66.0 in

## 2018-12-23 DIAGNOSIS — M545 Low back pain, unspecified: Secondary | ICD-10-CM

## 2018-12-23 LAB — URINALYSIS, COMPLETE
Bilirubin, UA: NEGATIVE
GLUCOSE, UA: NEGATIVE
Leukocytes, UA: NEGATIVE
Nitrite, UA: NEGATIVE
Protein, UA: NEGATIVE
Urobilinogen, Ur: 0.2 mg/dL (ref 0.2–1.0)
pH, UA: 5.5 (ref 5.0–7.5)

## 2018-12-23 LAB — MICROSCOPIC EXAMINATION
Bacteria, UA: NONE SEEN
Renal Epithel, UA: NONE SEEN /hpf

## 2018-12-23 MED ORDER — DICLOFENAC SODIUM 75 MG PO TBEC: 75.0000 mg | DELAYED_RELEASE_TABLET | Freq: Two times a day (BID) | ORAL | 0 refills | Status: DC

## 2018-12-23 MED ORDER — BACLOFEN 10 MG PO TABS: 10.0000 mg | ORAL_TABLET | Freq: Three times a day (TID) | ORAL | 2 refills | Status: DC

## 2018-12-23 MED ORDER — PREDNISONE 10 MG (21) PO TBPK: ORAL_TABLET | ORAL | 0 refills | Status: DC

## 2018-12-23 MED ORDER — KETOROLAC TROMETHAMINE 60 MG/2ML IM SOLN
60.0000 mg | Freq: Once | INTRAMUSCULAR | Status: AC
  Administered 2018-12-23: 60 mg via INTRAMUSCULAR

## 2018-12-23 NOTE — Progress Notes (Signed)
Subjective:    Patient ID: Morgan Mayo, female    DOB: August 08, 1981, 37 y.o.   MRN: [id number]  Chief Complaint  Patient presents with  . Back Pain    Back Pain  This is a new problem. The current episode started in the past 7 days. The problem occurs constantly. The problem is unchanged. The pain is present in the lumbar spine. The quality of the pain is described as aching. The pain does not radiate. The pain is at a severity of 8/10. The pain is moderate. Pertinent negatives include no bladder incontinence, bowel incontinence, dysuria, leg pain or weakness. She has tried bed rest and NSAIDs for the symptoms. The treatment provided mild relief.      Review of Systems  Gastrointestinal: Negative for bowel incontinence.  Genitourinary: Negative for bladder incontinence and dysuria.  Musculoskeletal: Positive for back pain.  Neurological: Negative for weakness.  All other systems reviewed and are negative.      Objective:   Physical Exam Vitals signs reviewed.  Constitutional:      General: She is not in acute distress.    Appearance: She is well-developed.  HENT:     Head: Normocephalic and atraumatic.     Right Ear: External ear normal.  Eyes:     Pupils: Pupils are equal, round, and reactive to light.  Neck:     Musculoskeletal: Normal range of motion and neck supple.     Thyroid: No thyromegaly.  Cardiovascular:     Rate and Rhythm: Normal rate and regular rhythm.     Heart sounds: Normal heart sounds. No murmur.  Pulmonary:     Effort: Pulmonary effort is normal. No respiratory distress.     Breath sounds: Normal breath sounds. No wheezing.  Abdominal:     General: Bowel sounds are normal. There is no distension.     Palpations: Abdomen is soft.     Tenderness: There is no abdominal tenderness.  Musculoskeletal:        General: No tenderness.     Comments: Negative CVA tenderness, pain in left lumbar with flexion and extension  Skin:    General: Skin is  warm and dry.  Neurological:     Mental Status: She is alert and oriented to person, place, and time.     Cranial Nerves: No cranial nerve deficit.     Deep Tendon Reflexes: Reflexes are normal and symmetric.  Psychiatric:        Behavior: Behavior normal.        Thought Content: Thought content normal.        Judgment: Judgment normal.      BP 129/86   Pulse 97   Temp 97.8 F (36.6 C) (Oral)   Ht 5\' 6"  (1.676 m)   BMI 29.86 kg/m      Assessment & Plan:  Morgan Mayo comes in today with chief complaint of Back Pain   Diagnosis and orders addressed:  1. Acute left-sided low back pain without sciatica Rest Ice ROM exercises encouraged Sedation precautions discussed  RTO if symptoms worsen or do not improve  - Urinalysis, Complete - ketorolac (TORADOL) injection 60 mg - predniSONE (STERAPRED UNI-PAK 21 TAB) 10 MG (21) TBPK tablet; Use as directed  Dispense: 21 tablet; Refill: 0 - baclofen (LIORESAL) 10 MG tablet; Take 1 tablet (10 mg total) by mouth 3 (three) times daily.  Dispense: 60 each; Refill: 2 - diclofenac (VOLTAREN) 75 MG EC tablet; Take 1 tablet (75 mg  total) by mouth 2 (two) times daily.  Dispense: 60 tablet; Refill: 0   Evelina Dun, FNP

## 2018-12-23 NOTE — Patient Instructions (Signed)
Acute Back Pain, Adult  Acute back pain is sudden and usually short-lived. It is often caused by an injury to the muscles and tissues in the back. The injury may result from:   A muscle or ligament getting overstretched or torn (strained). Ligaments are tissues that connect bones to each other. Lifting something improperly can cause a back strain.   Wear and tear (degeneration) of the spinal disks. Spinal disks are circular tissue that provides cushioning between the bones of the spine (vertebrae).   Twisting motions, such as while playing sports or doing yard work.   A hit to the back.   Arthritis.  You may have a physical exam, lab tests, and imaging tests to find the cause of your pain. Acute back pain usually goes away with rest and home care.  Follow these instructions at home:  Managing pain, stiffness, and swelling   Take over-the-counter and prescription medicines only as told by your health care provider.   Your health care provider may recommend applying ice during the first 24-48 hours after your pain starts. To do this:  ? Put ice in a plastic bag.  ? Place a towel between your skin and the bag.  ? Leave the ice on for 20 minutes, 2-3 times a day.   If directed, apply heat to the affected area as often as told by your health care provider. Use the heat source that your health care provider recommends, such as a moist heat pack or a heating pad.  ? Place a towel between your skin and the heat source.  ? Leave the heat on for 20-30 minutes.  ? Remove the heat if your skin turns bright red. This is especially important if you are unable to feel pain, heat, or cold. You have a greater risk of getting burned.  Activity     Do not stay in bed. Staying in bed for more than 1-2 days can delay your recovery.   Sit up and stand up straight. Avoid leaning forward when you sit, or hunching over when you stand.  ? If you work at a desk, sit close to it so you do not need to lean over. Keep your chin tucked  in. Keep your neck drawn back, and keep your elbows bent at a right angle. Your arms should look like the letter "L."  ? Sit high and close to the steering wheel when you drive. Add lower back (lumbar) support to your car seat, if needed.   Take short walks on even surfaces as soon as you are able. Try to increase the length of time you walk each day.   Do not sit, drive, or stand in one place for more than 30 minutes at a time. Sitting or standing for long periods of time can put stress on your back.   Do not drive or use heavy machinery while taking prescription pain medicine.   Use proper lifting techniques. When you bend and lift, use positions that put less stress on your back:  ? Bend your knees.  ? Keep the load close to your body.  ? Avoid twisting.   Exercise regularly as told by your health care provider. Exercising helps your back heal faster and helps prevent back injuries by keeping muscles strong and flexible.   Work with a physical therapist to make a safe exercise program, as recommended by your health care provider. Do any exercises as told by your physical therapist.  Lifestyle   Maintain   a healthy weight. Extra weight puts stress on your back and makes it difficult to have good posture.   Avoid activities or situations that make you feel anxious or stressed. Stress and anxiety increase muscle tension and can make back pain worse. Learn ways to manage anxiety and stress, such as through exercise.  General instructions   Sleep on a firm mattress in a comfortable position. Try lying on your side with your knees slightly bent. If you lie on your back, put a pillow under your knees.   Follow your treatment plan as told by your health care provider. This may include:  ? Cognitive or behavioral therapy.  ? Acupuncture or massage therapy.  ? Meditation or yoga.  Contact a health care provider if:   You have pain that is not relieved with rest or medicine.   You have increasing pain going down  into your legs or buttocks.   Your pain does not improve after 2 weeks.   You have pain at night.   You lose weight without trying.   You have a fever or chills.  Get help right away if:   You develop new bowel or bladder control problems.   You have unusual weakness or numbness in your arms or legs.   You develop nausea or vomiting.   You develop abdominal pain.   You feel faint.  Summary   Acute back pain is sudden and usually short-lived.   Use proper lifting techniques. When you bend and lift, use positions that put less stress on your back.   Take over-the-counter and prescription medicines and apply heat or ice as directed by your health care provider.  This information is not intended to replace advice given to you by your health care provider. Make sure you discuss any questions you have with your health care provider.  Document Released: 12/14/2005 Document Revised: 07/21/2018 Document Reviewed: 07/28/2017  Elsevier Interactive Patient Education  2019 [employer]

## 2019-03-21 ENCOUNTER — Other Ambulatory Visit: Payer: Self-pay | Admitting: *Deleted

## 2019-03-21 MED ORDER — AZITHROMYCIN 250 MG PO TABS: ORAL_TABLET | ORAL | 0 refills | Status: DC

## 2019-03-22 ENCOUNTER — Other Ambulatory Visit: Payer: Self-pay | Admitting: *Deleted

## 2019-03-22 MED ORDER — LORATADINE 10 MG PO TABS: 10.0000 mg | ORAL_TABLET | Freq: Every day | ORAL | 3 refills | Status: AC

## 2019-08-09 ENCOUNTER — Other Ambulatory Visit: Payer: Self-pay

## 2019-08-09 DIAGNOSIS — R21 Rash and other nonspecific skin eruption: Secondary | ICD-10-CM

## 2019-08-09 DIAGNOSIS — B354 Tinea corporis: Secondary | ICD-10-CM

## 2019-08-09 MED ORDER — TRIAMCINOLONE ACETONIDE 0.1 % EX OINT: 1.0000 "application " | TOPICAL_OINTMENT | Freq: Two times a day (BID) | CUTANEOUS | 1 refills | Status: DC

## 2019-08-15 DIAGNOSIS — Z01419 Encounter for gynecological examination (general) (routine) without abnormal findings: Secondary | ICD-10-CM | POA: Diagnosis not present

## 2019-08-15 DIAGNOSIS — Z309 Encounter for contraceptive management, unspecified: Secondary | ICD-10-CM | POA: Diagnosis not present

## 2019-08-15 DIAGNOSIS — N941 Unspecified dyspareunia: Secondary | ICD-10-CM | POA: Diagnosis not present

## 2019-08-15 DIAGNOSIS — Z6829 Body mass index (BMI) 29.0-29.9, adult: Secondary | ICD-10-CM | POA: Diagnosis not present

## 2019-09-05 ENCOUNTER — Ambulatory Visit (INDEPENDENT_AMBULATORY_CARE_PROVIDER_SITE_OTHER): Payer: 59 | Admitting: Family Medicine

## 2019-09-05 ENCOUNTER — Other Ambulatory Visit: Payer: Self-pay

## 2019-09-05 VITALS — BP 126/87 | HR 110 | Temp 97.3°F | Ht 66.0 in | Wt 185.0 lb

## 2019-09-05 DIAGNOSIS — S99922A Unspecified injury of left foot, initial encounter: Secondary | ICD-10-CM

## 2019-09-05 NOTE — Patient Instructions (Signed)
Contusion A contusion is a deep bruise. This is a result of an injury that causes bleeding under the skin. Symptoms of bruising include pain, swelling, and discolored skin. The skin may turn blue, purple, or yellow. Follow these instructions at home: Managing pain, stiffness, and swelling You may use RICE. This stands for:  Resting.  Icing.  Compression, or putting pressure.  Elevating, or raising the injured area. To follow this method, do these actions:  Rest the injured area.  If told, put ice on the injured area. ? Put ice in a plastic bag. ? Place a towel between your skin and the bag. ? Leave the ice on for 20 minutes, 2-3 times per day.  If told, put light pressure (compression) on the injured area using an elastic bandage. Make sure the bandage is not too tight. If the area tingles or becomes numb, remove it and put it back on as told by your doctor.  If possible, raise (elevate) the injured area above the level of your heart while you are sitting or lying down.  General instructions  Take over-the-counter and prescription medicines only as told by your doctor.  Keep all follow-up visits as told by your doctor. This is important. Contact a doctor if:  Your symptoms do not get better after several days of treatment.  Your symptoms get worse.  You have trouble moving the injured area. Get help right away if:  You have very bad pain.  You have a loss of feeling (numbness) in a hand or foot.  Your hand or foot turns pale or cold. Summary  A contusion is a deep bruise. This is a result of an injury that causes bleeding under the skin.  Symptoms of bruising include pain, swelling, and discolored skin. The skin may turn blue, purple, or yellow.  This condition is treated with rest, ice, compression, and elevation. This is also called RICE. You may be given over-the-counter medicines for pain.  Contact a doctor if you do not feel better, or you feel worse. Get  help right away if you have very bad pain, have lost feeling in a hand or foot, or the area turns pale or cold. This information is not intended to replace advice given to you by your health care provider. Make sure you discuss any questions you have with your health care provider. Document Released: 06/01/2008 Document Revised: 08/05/2018 Document Reviewed: 08/05/2018 Elsevier Patient Education  2020 [employer] Inc[employer]  

## 2019-09-05 NOTE — Progress Notes (Signed)
Subjective: CC: foot pain PCP: Chevis Pretty, FNP [id number] Morgan Mayo is a 38 y.o. female presenting to clinic today for:  1. Foot pain Patient reports yesterday that she injured her left foot.  She notes pain wraps around the bottom of her mid foot.  She was leaning over in her car with her left foot on the rail when her car door slammed on it.  She notes that it pinched and stung then but she was able to ambulate.  Denies any ecchymosis, change in color or immediate swelling.  After she ambulated around the store she had significant pains that she could not put pressure on it.  She notes that she took Aleve and applied Voltaren gel and wrapped an Ace bandage.  Today, she notes that about 70% better but that she was worried that there was a potential fracture given the significant pain she injured yesterday.  She does not report any sensation changes.   ROS: Per HPI  No Known Allergies Past Medical History:  Diagnosis Date  . Abnormal Pap smear     Current Outpatient Medications:  .  baclofen (LIORESAL) 10 MG tablet, Take 1 tablet (10 mg total) by mouth 3 (three) times daily., Disp: 60 each, Rfl: 2 .  diclofenac (VOLTAREN) 75 MG EC tablet, Take 1 tablet (75 mg total) by mouth 2 (two) times daily., Disp: 60 tablet, Rfl: 0 .  etonogestrel-ethinyl estradiol (NUVARING) 0.12-0.015 MG/24HR vaginal ring, Insert vaginally and leave in place for 3 consecutive weeks, then remove for 1 week., Disp: 1 each, Rfl: 1 .  loratadine (CLARITIN) 10 MG tablet, Take 1 tablet (10 mg total) by mouth daily., Disp: 90 tablet, Rfl: 3 .  meloxicam (MOBIC) 15 MG tablet, 1/2 - 1 tablet daily as needed (Patient not taking: Reported on 12/23/2018), Disp: 90 tablet, Rfl: 0 .  triamcinolone ointment (KENALOG) 0.1 %, Apply 1 application topically 2 (two) times daily., Disp: 30 g, Rfl: 1 Social History   Socioeconomic History  . Marital status: Married    Spouse name: Not on file  . Number of children: Not on  file  . Years of education: Not on file  . Highest education level: Not on file  Occupational History  . Not on file  Social Needs  . Financial resource strain: Not on file  . Food insecurity    Worry: Not on file    Inability: Not on file  . Transportation needs    Medical: Not on file    Non-medical: Not on file  Tobacco Use  . Smoking status: Never Smoker  . Smokeless tobacco: Never Used  Substance and Sexual Activity  . Alcohol use: No  . Drug use: No  . Sexual activity: Yes    Birth control/protection: None, Inserts  Lifestyle  . Physical activity    Days per week: Not on file    Minutes per session: Not on file  . Stress: Not on file  Relationships  . Social Herbalist on phone: Not on file    Gets together: Not on file    Attends religious service: Not on file    Active member of club or organization: Not on file    Attends meetings of clubs or organizations: Not on file    Relationship status: Not on file  . Intimate partner violence    Fear of current or ex partner: Not on file    Emotionally abused: Not on file    Physically abused:  Not on file    Forced sexual activity: Not on file  Other Topics Concern  . Not on file  Social History Narrative  . Not on file   Family History  Problem Relation Age of Onset  . Hyperlipidemia Mother   . Hypertension Mother   . Diabetes Mother   . Hypothyroidism Mother     Objective: Office vital signs reviewed. BP 126/87   Pulse (!) 110   Temp (!) 97.3 F (36.3 C) (Temporal)   Ht 5\' 6"  (1.676 m)   Wt 185 lb (83.9 kg)   BMI 29.86 kg/m   Physical Examination:  General: Awake, alert, well nourished, No acute distress MSK:   Left foot: She has full active range of motion but has pain with eversion, inversion and plantar flexion.  No gross deformity, soft tissue swelling or skin discoloration.  She has no tenderness palpation over the ATFL, distal medial or lateral malleoli.  No tenderness palpation along  any of the metatarsals.  Negative squeeze test. Neuro: Light touch and station grossly intact  Assessment/ Plan: 38 y.o. female   1. Injury of left foot, initial encounter Suspect bony contusion.  Nothing on exam to suggest that she has a fracture.  Her symptoms have improved quite significantly with oral NSAIDs and Ace bandages.  I recommended that she continue this.  It would probably be a good idea for her to keep her foot elevated for the next few days as she heals.  We discussed that if symptoms were to worsen or not improve as expected that we should consider x-ray.  I will put a future order in so that she can get this at her convenience if needed.   No orders of the defined types were placed in this encounter.  No orders of the defined types were placed in this encounter.    Morgan Norlander, DO [facility] [phone]

## 2020-03-18 ENCOUNTER — Other Ambulatory Visit: Payer: Self-pay | Admitting: Family Medicine

## 2020-03-18 DIAGNOSIS — B354 Tinea corporis: Secondary | ICD-10-CM

## 2020-03-18 MED ORDER — TERBINAFINE HCL 250 MG PO TABS: 250.0000 mg | ORAL_TABLET | Freq: Every day | ORAL | 2 refills | Status: AC

## 2020-09-06 ENCOUNTER — Other Ambulatory Visit: Payer: Self-pay

## 2020-09-06 ENCOUNTER — Ambulatory Visit: Payer: 59

## 2020-09-06 DIAGNOSIS — Z23 Encounter for immunization: Secondary | ICD-10-CM

## 2020-11-27 DIAGNOSIS — Z309 Encounter for contraceptive management, unspecified: Secondary | ICD-10-CM | POA: Diagnosis not present

## 2020-11-27 DIAGNOSIS — Z01419 Encounter for gynecological examination (general) (routine) without abnormal findings: Secondary | ICD-10-CM | POA: Diagnosis not present

## 2020-11-27 DIAGNOSIS — Z6830 Body mass index (BMI) 30.0-30.9, adult: Secondary | ICD-10-CM | POA: Diagnosis not present

## 2020-12-06 ENCOUNTER — Other Ambulatory Visit: Payer: Self-pay

## 2020-12-06 ENCOUNTER — Other Ambulatory Visit (HOSPITAL_COMMUNITY): Payer: Self-pay | Admitting: Nurse Practitioner

## 2020-12-06 ENCOUNTER — Ambulatory Visit (INDEPENDENT_AMBULATORY_CARE_PROVIDER_SITE_OTHER): Payer: 59 | Admitting: Pharmacist

## 2020-12-06 VITALS — Wt 191.0 lb

## 2020-12-06 DIAGNOSIS — E8881 Metabolic syndrome: Secondary | ICD-10-CM

## 2020-12-06 MED ORDER — OZEMPIC (0.25 OR 0.5 MG/DOSE) 2 MG/1.5ML ~~LOC~~ SOPN: 0.5000 mg | PEN_INJECTOR | SUBCUTANEOUS | 3 refills | Status: DC

## 2020-12-06 MED FILL — OZEMPIC 0.25 OR 0.5 MG/DOSE: 2 | 84 days supply | Qty: 5 | Fill #0

## 2020-12-16 ENCOUNTER — Telehealth: Payer: Self-pay | Admitting: Pharmacist

## 2020-12-16 MED ORDER — ONDANSETRON 4 MG PO TBDP: 4.0000 mg | ORAL_TABLET | Freq: Three times a day (TID) | ORAL | 0 refills | Status: DC | PRN

## 2020-12-16 NOTE — Telephone Encounter (Signed)
Patient having nausea with ozempic zofran called in

## 2020-12-16 NOTE — Progress Notes (Signed)
    12/06/2020 Name: Morgan Mayo MRN: [id number] DOB: 09/18/1981  S:  62 yoF presents for weight loss evaluation, education, and management.  She would like to lose weight and interested in [place].  She is currently 191lbs.   Patientreportsadherence with medications.  Currentmedications for weight loss: n/a  ? Unable to take stimulants due to cardiac history (PVCs)  Patient is active during the day.    O:  Current Weight 191lbs  Lipid Panel Lipid Panel     Component Value Date/Time   CHOL 172 08/28/2015 0826   TRIG 75 08/28/2015 0826   TRIG 67 11/02/2013 0802   HDL 77 08/28/2015 0826   HDL 64 11/02/2013 0802   CHOLHDL 2.2 08/28/2015 0826   LDLCALC 80 08/28/2015 0826   LDLCALC 76 11/02/2013 0802   LABVLDL 15 08/28/2015 0826     A/P:   -Healthy eating and meal planning discussed  -Will start Ozempic (semaglutide) 0.25mg  sq weekly for 4 weeks, then increase to 0.5mg  sq weekly for 4 weeks (will titrate as needed). Patient denies history of thyroid/medullary cancer.  3 month supply called in to [facility] (copay $25).  May transition to [facility] if patient able to titrate to higher doses of semaglutide  Written patient instructions provided.  Total time in face to face counseling 20 minutes.   Regina Eck, PharmD, BCPS Clinical Pharmacist, [place]  II Phone [phone]

## 2020-12-17 ENCOUNTER — Other Ambulatory Visit: Payer: Self-pay

## 2020-12-17 ENCOUNTER — Other Ambulatory Visit: Payer: 59

## 2020-12-17 DIAGNOSIS — E8881 Metabolic syndrome: Secondary | ICD-10-CM | POA: Diagnosis not present

## 2020-12-17 NOTE — Addendum Note (Signed)
Addended by: Liliane Bade on: 12/17/2020 11:25 AM   Modules accepted: Orders

## 2020-12-18 LAB — CMP14+EGFR
ALT: 16 IU/L (ref 0–32)
AST: 16 IU/L (ref 0–40)
Albumin/Globulin Ratio: 1.3 (ref 1.2–2.2)
Albumin: 4 g/dL (ref 3.8–4.8)
Alkaline Phosphatase: 91 IU/L (ref 44–121)
BUN/Creatinine Ratio: 15 (ref 9–23)
BUN: 13 mg/dL (ref 6–20)
Bilirubin Total: 0.3 mg/dL (ref 0.0–1.2)
CO2: 21 mmol/L (ref 20–29)
Calcium: 8.9 mg/dL (ref 8.7–10.2)
Chloride: 104 mmol/L (ref 96–106)
Creatinine, Ser: 0.88 mg/dL (ref 0.57–1.00)
GFR calc Af Amer: 96 mL/min/{1.73_m2} (ref >59)
GFR calc non Af Amer: 84 mL/min/{1.73_m2} (ref >59)
Globulin, Total: 3.1 g/dL (ref 1.5–4.5)
Glucose: 87 mg/dL (ref 65–99)
Potassium: 4.4 mmol/L (ref 3.5–5.2)
Sodium: 139 mmol/L (ref 134–144)
Total Protein: 7.1 g/dL (ref 6.0–8.5)

## 2020-12-18 LAB — LIPID PANEL
Chol/HDL Ratio: 2.3 ratio (ref 0.0–4.4)
Cholesterol, Total: 147 mg/dL (ref 100–199)
HDL: 63 mg/dL (ref >39)
LDL Chol Calc (NIH): 67 mg/dL (ref 0–99)
Triglycerides: 89 mg/dL (ref 0–149)
VLDL Cholesterol Cal: 17 mg/dL (ref 5–40)

## 2020-12-18 LAB — TSH: TSH: 2.41 u[IU]/mL (ref 0.450–4.500)

## 2020-12-18 LAB — T3, FREE: T3, Free: 3.9 pg/mL (ref 2.0–4.4)

## 2020-12-18 LAB — T4, FREE: Free T4: 1.39 ng/dL (ref 0.82–1.77)

## 2021-02-19 ENCOUNTER — Telehealth: Payer: Self-pay | Admitting: *Deleted

## 2021-02-19 NOTE — Telephone Encounter (Signed)
Patient aware.

## 2021-02-19 NOTE — Telephone Encounter (Signed)
Patient would like to see about increasing her ozempic from 0.5 to .0.75. Patient is willing to giver herself two shots is this okay? Patient still has plenty ozempic and would like to try and stick with ozempic. She had nausea when increasing from 0.25-0.5 and would just like to increase slowly.

## 2021-02-19 NOTE — Telephone Encounter (Signed)
She can increase to 0.75 or 1 mg, which ever she would like to do.

## 2021-03-28 DIAGNOSIS — Z3169 Encounter for other general counseling and advice on procreation: Secondary | ICD-10-CM | POA: Diagnosis not present

## 2021-04-25 ENCOUNTER — Other Ambulatory Visit (HOSPITAL_COMMUNITY): Payer: Self-pay

## 2021-04-25 MED FILL — Semaglutide Soln Pen-inj 0.25 or 0.5 MG/DOSE (2 MG/1.5ML): SUBCUTANEOUS | 28 days supply | Qty: 1.5 | Fill #0 | Status: CN

## 2021-04-29 ENCOUNTER — Other Ambulatory Visit (HOSPITAL_COMMUNITY): Payer: Self-pay

## 2021-04-29 ENCOUNTER — Telehealth: Payer: Self-pay | Admitting: Pharmacist

## 2021-04-29 MED ORDER — OZEMPIC (0.25 OR 0.5 MG/DOSE) 2 MG/1.5ML ~~LOC~~ SOPN
0.5000 mg | PEN_INJECTOR | SUBCUTANEOUS | 3 refills | Status: AC
  Filled 2021-04-29 – 2021-05-19 (×2): qty 4.5, 84d supply, fill #0
  Filled 2021-09-25: qty 1.5, 28d supply, fill #1
  Filled 2021-12-30: qty 1.5, 28d supply, fill #2
  Filled 2022-02-18: qty 4.5, 84d supply, fill #3

## 2021-04-29 NOTE — Telephone Encounter (Signed)
Refills for ozempic sent in Patient tolerating well She has lost 33lbs  Continue current regimen

## 2021-05-08 ENCOUNTER — Other Ambulatory Visit (HOSPITAL_COMMUNITY): Payer: Self-pay

## 2021-05-19 ENCOUNTER — Other Ambulatory Visit (HOSPITAL_COMMUNITY): Payer: Self-pay

## 2021-05-20 ENCOUNTER — Other Ambulatory Visit (HOSPITAL_COMMUNITY): Payer: Self-pay

## 2021-09-25 ENCOUNTER — Other Ambulatory Visit (HOSPITAL_COMMUNITY): Payer: Self-pay

## 2021-12-30 ENCOUNTER — Other Ambulatory Visit (HOSPITAL_COMMUNITY): Payer: Self-pay

## 2022-01-01 ENCOUNTER — Other Ambulatory Visit (HOSPITAL_COMMUNITY): Payer: Self-pay

## 2022-02-06 ENCOUNTER — Encounter: Payer: Self-pay | Admitting: Family Medicine

## 2022-02-06 ENCOUNTER — Ambulatory Visit (INDEPENDENT_AMBULATORY_CARE_PROVIDER_SITE_OTHER): Payer: No Typology Code available for payment source | Admitting: Family Medicine

## 2022-02-06 VITALS — BP 122/78 | HR 123 | Temp 96.8°F | Ht 66.0 in | Wt 153.0 lb

## 2022-02-06 DIAGNOSIS — H109 Unspecified conjunctivitis: Secondary | ICD-10-CM | POA: Diagnosis not present

## 2022-02-06 DIAGNOSIS — B9689 Other specified bacterial agents as the cause of diseases classified elsewhere: Secondary | ICD-10-CM

## 2022-02-06 MED ORDER — POLYMYXIN B-TRIMETHOPRIM 10000-0.1 UNIT/ML-% OP SOLN: 1.0000 [drp] | Freq: Four times a day (QID) | OPHTHALMIC | 0 refills | Status: DC

## 2022-02-06 NOTE — Progress Notes (Signed)
° °  Assessment & Plan:  1. Bacterial conjunctivitis of both eyes Education provided on bacterial conjunctivitis.  - trimethoprim-polymyxin b (POLYTRIM) ophthalmic solution; Place 1 drop into both eyes every 6 (six) hours.  Dispense: 10 mL; Refill: 0   Follow up plan: Return if symptoms worsen or fail to improve.  Hendricks Limes, MSN, APRN, FNP-C [facility]  Subjective:   Patient ID: Morgan Mayo, female    DOB: 1981-06-19, 41 y.o.   MRN: [id number]  HPI: Morgan Mayo is a 41 y.o. female presenting on 02/06/2022 for Conjunctivitis (Bilateral )  Patient reports irritation, redness and drainage of both eyes that initially started in the left eye and is now in the right. All of this started yesterday morning.   ROS: Negative unless specifically indicated above in HPI.   Relevant past medical history reviewed and updated as indicated.   Allergies and medications reviewed and updated.   Current Outpatient Medications:    etonogestrel-ethinyl estradiol (NUVARING) 0.12-0.015 MG/24HR vaginal ring, Insert vaginally and leave in place for 3 consecutive weeks, then remove for 1 week., Disp: 1 each, Rfl: 1   loratadine (CLARITIN) 10 MG tablet, Take 1 tablet (10 mg total) by mouth daily., Disp: 90 tablet, Rfl: 3   meloxicam (MOBIC) 15 MG tablet, 1/2 - 1 tablet daily as needed, Disp: 90 tablet, Rfl: 0   Semaglutide,0.25 or 0.5MG /DOS, (OZEMPIC, 0.25 OR 0.5 MG/DOSE,) 2 MG/1.5ML SOPN, Inject 0.5 mg into the skin once a week. Fill 3 month supply, Disp: 4.5 mL, Rfl: 3  No Known Allergies  Objective:   BP 122/78    Pulse (!) 123    Temp (!) 96.8 F (36 C) (Temporal)    Ht 5\' 6"  (1.676 m)    Wt 153 lb (69.4 kg)    BMI 24.69 kg/m    Physical Exam Vitals reviewed.  Constitutional:      General: She is not in acute distress.    Appearance: Normal appearance. She is not ill-appearing, toxic-appearing or diaphoretic.  HENT:     Head: Normocephalic and atraumatic.   Eyes:     General: No scleral icterus.       Right eye: Discharge present.        Left eye: Discharge present.    Conjunctiva/sclera:     Right eye: Right conjunctiva is injected.     Left eye: Left conjunctiva is injected.  Cardiovascular:     Rate and Rhythm: Normal rate.  Pulmonary:     Effort: Pulmonary effort is normal. No respiratory distress.  Musculoskeletal:        General: Normal range of motion.     Cervical back: Normal range of motion.  Skin:    General: Skin is warm and dry.     Capillary Refill: Capillary refill takes less than 2 seconds.  Neurological:     General: No focal deficit present.     Mental Status: She is alert and oriented to person, place, and time. Mental status is at baseline.  Psychiatric:        Mood and Affect: Mood normal.        Behavior: Behavior normal.        Thought Content: Thought content normal.        Judgment: Judgment normal.

## 2022-02-18 ENCOUNTER — Telehealth: Payer: Self-pay | Admitting: Nurse Practitioner

## 2022-02-18 ENCOUNTER — Other Ambulatory Visit: Payer: Self-pay | Admitting: Family Medicine

## 2022-02-18 ENCOUNTER — Other Ambulatory Visit (HOSPITAL_COMMUNITY): Payer: Self-pay

## 2022-02-18 DIAGNOSIS — R059 Cough, unspecified: Secondary | ICD-10-CM

## 2022-02-18 DIAGNOSIS — J301 Allergic rhinitis due to pollen: Secondary | ICD-10-CM

## 2022-02-18 MED ORDER — MONTELUKAST SODIUM 10 MG PO TABS
10.0000 mg | ORAL_TABLET | Freq: Every day | ORAL | 3 refills | Status: AC
Start: 2022-02-18 — End: 2022-12-08
  Filled 2022-02-18: qty 90, 90d supply, fill #0
  Filled 2022-05-12: qty 90, 90d supply, fill #1
  Filled 2022-09-04: qty 90, 90d supply, fill #2
  Filled 2022-11-24 – 2022-12-22 (×2): qty 90, 90d supply, fill #3

## 2022-02-18 NOTE — Telephone Encounter (Signed)
Yes, we will do singulair nightly and Flonase daily. Make sure to report new, worsening, or persistent symptoms. Local honey would be beneficial too.

## 2022-02-18 NOTE — Telephone Encounter (Signed)
Patient aware.

## 2022-02-18 NOTE — Telephone Encounter (Signed)
Patient is complaining with a cough now for 5-6 weeks. Do you think she could try some singulair. She does not have any sinus pressure. Her drainage is clear she just has a constant cough.

## 2022-02-19 ENCOUNTER — Other Ambulatory Visit (HOSPITAL_COMMUNITY): Payer: Self-pay

## 2022-04-06 ENCOUNTER — Other Ambulatory Visit (HOSPITAL_BASED_OUTPATIENT_CLINIC_OR_DEPARTMENT_OTHER): Payer: Self-pay

## 2022-05-12 ENCOUNTER — Other Ambulatory Visit (HOSPITAL_COMMUNITY): Payer: Self-pay

## 2022-07-08 ENCOUNTER — Other Ambulatory Visit (HOSPITAL_BASED_OUTPATIENT_CLINIC_OR_DEPARTMENT_OTHER): Payer: Self-pay

## 2022-07-21 ENCOUNTER — Ambulatory Visit (INDEPENDENT_AMBULATORY_CARE_PROVIDER_SITE_OTHER): Payer: No Typology Code available for payment source

## 2022-07-21 ENCOUNTER — Other Ambulatory Visit: Payer: Self-pay

## 2022-07-21 DIAGNOSIS — M25531 Pain in right wrist: Secondary | ICD-10-CM | POA: Diagnosis not present

## 2022-07-22 ENCOUNTER — Encounter: Payer: Self-pay | Admitting: Family Medicine

## 2022-07-22 ENCOUNTER — Other Ambulatory Visit (HOSPITAL_COMMUNITY): Payer: Self-pay

## 2022-07-22 ENCOUNTER — Ambulatory Visit (INDEPENDENT_AMBULATORY_CARE_PROVIDER_SITE_OTHER): Payer: No Typology Code available for payment source | Admitting: Family Medicine

## 2022-07-22 VITALS — BP 108/71 | HR 89 | Temp 98.7°F | Ht 66.0 in | Wt 154.0 lb

## 2022-07-22 DIAGNOSIS — M6740 Ganglion, unspecified site: Secondary | ICD-10-CM | POA: Diagnosis not present

## 2022-07-22 DIAGNOSIS — M25531 Pain in right wrist: Secondary | ICD-10-CM | POA: Diagnosis not present

## 2022-07-22 MED ORDER — DICLOFENAC SODIUM 75 MG PO TBEC
75.0000 mg | DELAYED_RELEASE_TABLET | Freq: Two times a day (BID) | ORAL | 6 refills | Status: AC
  Filled 2022-07-22: qty 30, 15d supply, fill #0
  Filled 2022-09-04: qty 30, 15d supply, fill #1

## 2022-07-22 MED ORDER — DICLOFENAC SODIUM 1 % EX GEL
2.0000 g | Freq: Four times a day (QID) | CUTANEOUS | 0 refills | Status: AC
  Filled 2022-07-22: qty 100, 12d supply, fill #0
  Filled 2022-07-23: qty 100, 5d supply, fill #0

## 2022-07-22 NOTE — Progress Notes (Signed)
Subjective:  Patient ID: Morgan Mayo, female    DOB: [birth year], 41 y.o.   MRN: [id number]  Patient Care Team: Chevis Pretty, FNP as PCP - General (Family Medicine)   Chief Complaint:  Wrist Pain (Right)   HPI: Morgan Mayo is a 41 y.o. female presenting on 07/22/2022 for Wrist Pain (Right)   Wrist Pain  The pain is present in the right wrist. This is a recurrent problem. The current episode started more than 1 month ago. There has been no history of extremity trauma. The problem occurs intermittently. The problem has been waxing and waning. The quality of the pain is described as aching and sharp. The pain is moderate. Associated symptoms include joint swelling and a limited range of motion. Pertinent negatives include no fever, inability to bear weight, itching, joint locking, numbness, stiffness or tingling. The symptoms are aggravated by activity. She has tried NSAIDS for the symptoms. The treatment provided mild relief.    Relevant past medical, surgical, family, and social history reviewed and updated as indicated.  Allergies and medications reviewed and updated. Data reviewed: Chart in Epic.   Past Medical History:  Diagnosis Date   Abnormal Pap smear     Past Surgical History:  Procedure Laterality Date   belly button      Social History   Socioeconomic History   Marital status: Married    Spouse name: Not on file   Number of children: Not on file   Years of education: Not on file   Highest education level: Not on file  Occupational History   Not on file  Tobacco Use   Smoking status: Never   Smokeless tobacco: Never  Vaping Use   Vaping Use: Never used  Substance and Sexual Activity   Alcohol use: No   Drug use: No   Sexual activity: Yes    Birth control/protection: None, Inserts  Other Topics Concern   Not on file  Social History Narrative   Not on file   Social Determinants of Health   Financial Resource Strain: Not on file   Food Insecurity: Not on file  Transportation Needs: Not on file  Physical Activity: Not on file  Stress: Not on file  Social Connections: Not on file  Intimate Partner Violence: Not on file    Outpatient Encounter Medications as of 07/22/2022  Medication Sig   diclofenac (VOLTAREN) 75 MG EC tablet Take 1 tablet (75 mg total) by mouth 2 (two) times daily.   diclofenac Sodium (VOLTAREN) 1 % GEL Apply 2 g topically 4 (four) times daily.   etonogestrel-ethinyl estradiol (NUVARING) 0.12-0.015 MG/24HR vaginal ring Insert vaginally and leave in place for 3 consecutive weeks, then remove for 1 week.   loratadine (CLARITIN) 10 MG tablet Take 1 tablet (10 mg total) by mouth daily.   montelukast (SINGULAIR) 10 MG tablet Take 1 tablet (10 mg total) by mouth at bedtime.   Semaglutide,0.25 or 0.5MG/DOS, (OZEMPIC, 0.25 OR 0.5 MG/DOSE,) 2 MG/1.5ML SOPN Inject 0.5 mg into the skin once a week. Fill 3 month supply   [DISCONTINUED] meloxicam (MOBIC) 15 MG tablet 1/2 - 1 tablet daily as needed   [DISCONTINUED] trimethoprim-polymyxin b (POLYTRIM) ophthalmic solution Place 1 drop into both eyes every 6 (six) hours.   No facility-administered encounter medications on file as of 07/22/2022.    No Known Allergies  Review of Systems  Constitutional:  Negative for activity change, appetite change, chills, diaphoresis, fatigue, fever and unexpected weight change.  Respiratory:  Negative for cough and shortness of breath.   Cardiovascular:  Negative for chest pain, palpitations and leg swelling.  Musculoskeletal:  Positive for arthralgias and joint swelling. Negative for back pain, gait problem, myalgias, neck pain and stiffness.  Skin:  Negative for itching.  Neurological:  Negative for tingling, weakness and numbness.  Psychiatric/Behavioral:  Negative for confusion.   All other systems reviewed and are negative.       Objective:  BP 108/71   Pulse 89   Temp 98.7 F (37.1 C)   Ht 5' 6"  (1.676 m)   Wt  154 lb (69.9 kg)   SpO2 95%   BMI 24.86 kg/m    Wt Readings from Last 3 Encounters:  07/22/22 154 lb (69.9 kg)  02/06/22 153 lb (69.4 kg)  12/06/20 191 lb (86.6 kg)    Physical Exam Nursing note reviewed.  Constitutional:      General: She is not in acute distress.    Appearance: Normal appearance. She is normal weight. She is not ill-appearing, toxic-appearing or diaphoretic.  HENT:     Head: Normocephalic and atraumatic.  Eyes:     Pupils: Pupils are equal, round, and reactive to light.  Cardiovascular:     Rate and Rhythm: Normal rate.     Pulses: Normal pulses.  Pulmonary:     Effort: Pulmonary effort is normal.  Musculoskeletal:     Right forearm: Normal.     Right wrist: Swelling and tenderness present. No deformity, effusion, lacerations, bony tenderness, snuff box tenderness or crepitus. Normal range of motion. Normal pulse.     Right hand: Normal.       Arms:  Skin:    General: Skin is warm and dry.     Capillary Refill: Capillary refill takes less than 2 seconds.  Neurological:     Mental Status: She is alert.  Psychiatric:        Mood and Affect: Mood normal.        Behavior: Behavior normal.        Thought Content: Thought content normal.        Judgment: Judgment normal.     Results for orders placed or performed in visit on 12/17/20  CMP14+EGFR  Result Value Ref Range   Glucose 87 65 - 99 mg/dL   BUN 13 6 - 20 mg/dL   Creatinine, Ser 0.88 0.57 - 1.00 mg/dL   GFR calc non Af Amer 84 >59 mL/min/1.73   GFR calc Af Amer 96 >59 mL/min/1.73   BUN/Creatinine Ratio 15 9 - 23   Sodium 139 134 - 144 mmol/L   Potassium 4.4 3.5 - 5.2 mmol/L   Chloride 104 96 - 106 mmol/L   CO2 21 20 - 29 mmol/L   Calcium 8.9 8.7 - 10.2 mg/dL   Total Protein 7.1 6.0 - 8.5 g/dL   Albumin 4.0 3.8 - 4.8 g/dL   Globulin, Total 3.1 1.5 - 4.5 g/dL   Albumin/Globulin Ratio 1.3 1.2 - 2.2   Bilirubin Total 0.3 0.0 - 1.2 mg/dL   Alkaline Phosphatase 91 44 - 121 IU/L   AST 16 0 -  40 IU/L   ALT 16 0 - 32 IU/L  Lipid Panel  Result Value Ref Range   Cholesterol, Total 147 100 - 199 mg/dL   Triglycerides 89 0 - 149 mg/dL   HDL 63 >39 mg/dL   VLDL Cholesterol Cal 17 5 - 40 mg/dL   LDL Chol Calc (NIH) 67 0 - 99  mg/dL   Chol/HDL Ratio 2.3 0.0 - 4.4 ratio  T4, Free  Result Value Ref Range   Free T4 1.39 0.82 - 1.77 ng/dL  T3, Free  Result Value Ref Range   T3, Free 3.9 2.0 - 4.4 pg/mL  TSH  Result Value Ref Range   TSH 2.410 0.450 - 4.500 uIU/mL       Pertinent labs & imaging results that were available during my care of the patient were reviewed by me and considered in my medical decision making.  Assessment & Plan:  Kerby was seen today for wrist pain.  Diagnoses and all orders for this visit:  Right wrist pain Ganglion cyst Would like to delay drainage and take NSAID therapy. Will trial topical, if not beneficial, will switch to oral. Symptomatic care discussed in detail. Imaging without acute findings. If symptoms persist or worsen, will refer to ortho. -     diclofenac (VOLTAREN) 75 MG EC tablet; Take 1 tablet (75 mg total) by mouth 2 (two) times daily. -     diclofenac Sodium (VOLTAREN) 1 % GEL; Apply 2 g topically 4 (four) times daily.     Continue all other maintenance medications.  Follow up plan: Return if symptoms worsen or fail to improve.   Continue healthy lifestyle choices, including diet (rich in fruits, vegetables, and lean proteins, and low in salt and simple carbohydrates) and exercise (at least 30 minutes of moderate physical activity daily).  Educational handout given for ganglion cyst drainage  The above assessment and management plan was discussed with the patient. The patient verbalized understanding of and has agreed to the management plan. Patient is aware to call the clinic if they develop any new symptoms or if symptoms persist or worsen. Patient is aware when to return to the clinic for a follow-up visit. Patient educated on  when it is appropriate to go to the emergency department.   Monia Pouch, FNP-C [facility] [phone]

## 2022-07-23 ENCOUNTER — Other Ambulatory Visit (HOSPITAL_COMMUNITY): Payer: Self-pay

## 2022-09-04 ENCOUNTER — Other Ambulatory Visit (HOSPITAL_COMMUNITY): Payer: Self-pay

## 2022-11-16 ENCOUNTER — Other Ambulatory Visit (HOSPITAL_BASED_OUTPATIENT_CLINIC_OR_DEPARTMENT_OTHER): Payer: Self-pay

## 2022-11-24 ENCOUNTER — Other Ambulatory Visit (HOSPITAL_BASED_OUTPATIENT_CLINIC_OR_DEPARTMENT_OTHER): Payer: Self-pay

## 2022-11-24 ENCOUNTER — Other Ambulatory Visit (HOSPITAL_COMMUNITY): Payer: Self-pay

## 2022-11-24 ENCOUNTER — Telehealth: Payer: Self-pay | Admitting: Family Medicine

## 2022-11-24 DIAGNOSIS — J019 Acute sinusitis, unspecified: Secondary | ICD-10-CM

## 2022-11-24 DIAGNOSIS — B9689 Other specified bacterial agents as the cause of diseases classified elsewhere: Secondary | ICD-10-CM

## 2022-11-24 MED ORDER — AMOXICILLIN-POT CLAVULANATE 875-125 MG PO TABS
1.0000 | ORAL_TABLET | Freq: Two times a day (BID) | ORAL | 0 refills | Status: AC
  Filled 2022-11-24: qty 14, 7d supply, fill #0

## 2022-11-24 NOTE — Progress Notes (Signed)

## 2022-11-25 ENCOUNTER — Other Ambulatory Visit (HOSPITAL_BASED_OUTPATIENT_CLINIC_OR_DEPARTMENT_OTHER): Payer: Self-pay

## 2022-12-02 ENCOUNTER — Other Ambulatory Visit (HOSPITAL_COMMUNITY): Payer: Self-pay

## 2022-12-22 ENCOUNTER — Other Ambulatory Visit: Payer: Self-pay

## 2023-01-02 ENCOUNTER — Other Ambulatory Visit (HOSPITAL_COMMUNITY): Payer: Self-pay

## 2023-04-16 DIAGNOSIS — D225 Melanocytic nevi of trunk: Secondary | ICD-10-CM | POA: Diagnosis not present

## 2023-04-16 DIAGNOSIS — Z1283 Encounter for screening for malignant neoplasm of skin: Secondary | ICD-10-CM | POA: Diagnosis not present

## 2023-04-16 DIAGNOSIS — Z01419 Encounter for gynecological examination (general) (routine) without abnormal findings: Secondary | ICD-10-CM | POA: Diagnosis not present

## 2023-04-16 DIAGNOSIS — Z6827 Body mass index (BMI) 27.0-27.9, adult: Secondary | ICD-10-CM | POA: Diagnosis not present

## 2023-04-16 DIAGNOSIS — Z1231 Encounter for screening mammogram for malignant neoplasm of breast: Secondary | ICD-10-CM | POA: Diagnosis not present

## 2023-04-16 LAB — HM PAP SMEAR: HM Pap smear: NEGATIVE

## 2023-04-16 LAB — HM MAMMOGRAPHY: HM Mammogram: NORMAL (ref 0–4)

## 2023-04-20 ENCOUNTER — Other Ambulatory Visit: Payer: Self-pay | Admitting: Obstetrics and Gynecology

## 2023-04-20 DIAGNOSIS — R928 Other abnormal and inconclusive findings on diagnostic imaging of breast: Secondary | ICD-10-CM

## 2023-05-04 ENCOUNTER — Ambulatory Visit
Admission: RE | Admit: 2023-05-04 | Discharge: 2023-05-04 | Disposition: A | Discharge status: Home / Self Care | Payer: 59 | Source: Ambulatory Visit | Attending: Obstetrics and Gynecology | Admitting: Obstetrics and Gynecology

## 2023-05-04 ENCOUNTER — Ambulatory Visit: Payer: Self-pay

## 2023-05-04 DIAGNOSIS — Z1231 Encounter for screening mammogram for malignant neoplasm of breast: Secondary | ICD-10-CM | POA: Diagnosis not present

## 2023-05-04 DIAGNOSIS — R928 Other abnormal and inconclusive findings on diagnostic imaging of breast: Secondary | ICD-10-CM

## 2023-06-28 ENCOUNTER — Other Ambulatory Visit (HOSPITAL_COMMUNITY): Payer: Self-pay

## 2023-06-28 MED ORDER — ETONOGESTREL-ETHINYL ESTRADIOL 0.12-0.015 MG/24HR VA RING
VAGINAL_RING | VAGINAL | 4 refills | Status: DC
  Filled 2023-06-28: qty 3, 84d supply, fill #0
  Filled 2023-09-16: qty 3, 84d supply, fill #1
  Filled 2023-12-06: qty 3, 84d supply, fill #2
  Filled 2024-03-06: qty 3, 84d supply, fill #3

## 2023-06-29 ENCOUNTER — Other Ambulatory Visit (HOSPITAL_COMMUNITY): Payer: Self-pay

## 2023-09-20 ENCOUNTER — Other Ambulatory Visit (HOSPITAL_COMMUNITY): Payer: Self-pay

## 2023-11-12 DIAGNOSIS — D225 Melanocytic nevi of trunk: Secondary | ICD-10-CM | POA: Diagnosis not present

## 2023-11-12 DIAGNOSIS — Z1283 Encounter for screening for malignant neoplasm of skin: Secondary | ICD-10-CM | POA: Diagnosis not present

## 2024-02-10 DIAGNOSIS — L820 Inflamed seborrheic keratosis: Secondary | ICD-10-CM | POA: Diagnosis not present

## 2024-02-10 DIAGNOSIS — B351 Tinea unguium: Secondary | ICD-10-CM | POA: Diagnosis not present

## 2024-02-25 DIAGNOSIS — H5213 Myopia, bilateral: Secondary | ICD-10-CM | POA: Diagnosis not present

## 2024-02-25 DIAGNOSIS — H52223 Regular astigmatism, bilateral: Secondary | ICD-10-CM | POA: Diagnosis not present

## 2024-03-15 ENCOUNTER — Other Ambulatory Visit: Payer: Self-pay

## 2024-05-04 ENCOUNTER — Other Ambulatory Visit (HOSPITAL_COMMUNITY): Payer: Self-pay

## 2024-05-04 DIAGNOSIS — Z124 Encounter for screening for malignant neoplasm of cervix: Secondary | ICD-10-CM | POA: Diagnosis not present

## 2024-05-04 DIAGNOSIS — Z1231 Encounter for screening mammogram for malignant neoplasm of breast: Secondary | ICD-10-CM | POA: Diagnosis not present

## 2024-05-04 DIAGNOSIS — Z6825 Body mass index (BMI) 25.0-25.9, adult: Secondary | ICD-10-CM | POA: Diagnosis not present

## 2024-05-04 DIAGNOSIS — Z01419 Encounter for gynecological examination (general) (routine) without abnormal findings: Secondary | ICD-10-CM | POA: Diagnosis not present

## 2024-05-04 MED ORDER — ETONOGESTREL-ETHINYL ESTRADIOL 0.12-0.015 MG/24HR VA RING
1.0000 | VAGINAL_RING | VAGINAL | 4 refills | Status: AC
  Filled 2024-05-04: qty 3, 90d supply, fill #0
  Filled 2024-06-25: qty 3, 84d supply, fill #0
  Filled 2024-09-13: qty 3, 84d supply, fill #1
  Filled 2024-09-23: qty 2, 56d supply, fill #1
  Filled 2024-09-23: qty 1, 28d supply, fill #1
  Filled 2024-10-14: qty 3, 84d supply, fill #2
  Filled 2025-01-06: qty 3, 84d supply, fill #3

## 2024-06-26 ENCOUNTER — Other Ambulatory Visit (HOSPITAL_COMMUNITY): Payer: Self-pay

## 2024-09-13 ENCOUNTER — Other Ambulatory Visit (HOSPITAL_COMMUNITY): Payer: Self-pay

## 2024-09-15 ENCOUNTER — Other Ambulatory Visit (HOSPITAL_COMMUNITY): Payer: Self-pay

## 2024-09-21 ENCOUNTER — Other Ambulatory Visit (HOSPITAL_COMMUNITY): Payer: Self-pay

## 2024-09-22 ENCOUNTER — Encounter (HOSPITAL_COMMUNITY): Payer: Self-pay

## 2024-09-23 ENCOUNTER — Other Ambulatory Visit: Payer: Self-pay

## 2024-09-23 ENCOUNTER — Other Ambulatory Visit (HOSPITAL_COMMUNITY): Payer: Self-pay

## 2024-09-25 ENCOUNTER — Other Ambulatory Visit (HOSPITAL_COMMUNITY): Payer: Self-pay

## 2024-09-26 ENCOUNTER — Other Ambulatory Visit (HOSPITAL_COMMUNITY): Payer: Self-pay

## 2024-10-03 ENCOUNTER — Other Ambulatory Visit (HOSPITAL_COMMUNITY): Payer: Self-pay

## 2024-10-09 ENCOUNTER — Other Ambulatory Visit (HOSPITAL_COMMUNITY): Payer: Self-pay

## 2024-10-16 ENCOUNTER — Other Ambulatory Visit (HOSPITAL_COMMUNITY): Payer: Self-pay

## 2024-11-17 DIAGNOSIS — Z1283 Encounter for screening for malignant neoplasm of skin: Secondary | ICD-10-CM | POA: Diagnosis not present

## 2024-11-17 DIAGNOSIS — D225 Melanocytic nevi of trunk: Secondary | ICD-10-CM | POA: Diagnosis not present

## 2024-12-06 ENCOUNTER — Other Ambulatory Visit (HOSPITAL_COMMUNITY): Payer: Self-pay

## 2024-12-08 ENCOUNTER — Other Ambulatory Visit (HOSPITAL_COMMUNITY): Payer: Self-pay

## 2024-12-18 ENCOUNTER — Other Ambulatory Visit (HOSPITAL_COMMUNITY): Payer: Self-pay

## 2024-12-22 ENCOUNTER — Other Ambulatory Visit (HOSPITAL_COMMUNITY): Payer: Self-pay

## 2025-01-02 ENCOUNTER — Other Ambulatory Visit (HOSPITAL_COMMUNITY): Payer: Self-pay

## 2025-01-03 ENCOUNTER — Other Ambulatory Visit (HOSPITAL_COMMUNITY): Payer: Self-pay
